# Patient Record
Sex: Female | Born: 1935 | Race: Black or African American | Hispanic: No | Smoking: Never smoker
Health system: Southern US, Community
[De-identification: ages and names within clinical notes are randomized; demographics above are authoritative.]

## PROBLEM LIST (undated history)

## (undated) DIAGNOSIS — M069 Rheumatoid arthritis, unspecified: Secondary | ICD-10-CM

## (undated) DIAGNOSIS — N189 Chronic kidney disease, unspecified: Secondary | ICD-10-CM

## (undated) DIAGNOSIS — F0390 Unspecified dementia without behavioral disturbance: Secondary | ICD-10-CM

## (undated) DIAGNOSIS — E785 Hyperlipidemia, unspecified: Secondary | ICD-10-CM

## (undated) DIAGNOSIS — I10 Essential (primary) hypertension: Secondary | ICD-10-CM

## (undated) HISTORY — PX: CATARACT EXTRACTION: SUR2

## (undated) HISTORY — DX: Hyperlipidemia, unspecified: E78.5

## (undated) HISTORY — DX: Essential (primary) hypertension: I10

## (undated) HISTORY — PX: HIATAL HERNIA REPAIR: SHX195

## (undated) HISTORY — DX: Chronic kidney disease, unspecified: N18.9

## (undated) HISTORY — PX: TUBAL LIGATION: SHX77

## (undated) HISTORY — DX: Unspecified dementia, unspecified severity, without behavioral disturbance, psychotic disturbance, mood disturbance, and anxiety: F03.90

## (undated) HISTORY — DX: Rheumatoid arthritis, unspecified: M06.9

## (undated) HISTORY — PX: ABDOMINAL HYSTERECTOMY: SUR658

---

## 1998-02-18 ENCOUNTER — Ambulatory Visit (HOSPITAL_COMMUNITY): Admission: RE | Admit: 1998-02-18 | Discharge: 1998-02-18 | Payer: Self-pay | Admitting: Cardiology

## 1998-03-08 ENCOUNTER — Ambulatory Visit (HOSPITAL_COMMUNITY): Admission: RE | Admit: 1998-03-08 | Discharge: 1998-03-08 | Payer: Self-pay | Admitting: Family Medicine

## 1998-04-01 ENCOUNTER — Ambulatory Visit (HOSPITAL_COMMUNITY): Admission: RE | Admit: 1998-04-01 | Discharge: 1998-04-01 | Payer: Self-pay | Admitting: Urology

## 2000-02-28 ENCOUNTER — Other Ambulatory Visit: Admission: RE | Admit: 2000-02-28 | Discharge: 2000-02-28 | Payer: Self-pay | Admitting: Family Medicine

## 2000-11-29 ENCOUNTER — Ambulatory Visit (HOSPITAL_COMMUNITY): Admission: RE | Admit: 2000-11-29 | Discharge: 2000-11-29 | Payer: Self-pay | Admitting: *Deleted

## 2000-12-11 ENCOUNTER — Ambulatory Visit (HOSPITAL_COMMUNITY): Admission: RE | Admit: 2000-12-11 | Discharge: 2000-12-11 | Payer: Self-pay | Admitting: *Deleted

## 2002-01-05 ENCOUNTER — Encounter: Payer: Self-pay | Admitting: Occupational Medicine

## 2002-01-05 ENCOUNTER — Encounter: Admission: RE | Admit: 2002-01-05 | Discharge: 2002-01-05 | Payer: Self-pay | Admitting: Occupational Medicine

## 2002-01-20 ENCOUNTER — Encounter: Admission: RE | Admit: 2002-01-20 | Discharge: 2002-01-27 | Payer: Self-pay | Admitting: Occupational Medicine

## 2002-07-28 ENCOUNTER — Other Ambulatory Visit: Admission: RE | Admit: 2002-07-28 | Discharge: 2002-07-28 | Payer: Self-pay | Admitting: Family Medicine

## 2002-07-31 ENCOUNTER — Encounter: Admission: RE | Admit: 2002-07-31 | Discharge: 2002-07-31 | Payer: Self-pay | Admitting: Occupational Medicine

## 2002-07-31 ENCOUNTER — Encounter: Payer: Self-pay | Admitting: Occupational Medicine

## 2004-08-31 ENCOUNTER — Other Ambulatory Visit: Admission: RE | Admit: 2004-08-31 | Discharge: 2004-08-31 | Payer: Self-pay | Admitting: Family Medicine

## 2010-10-16 ENCOUNTER — Encounter: Payer: Self-pay | Admitting: Internal Medicine

## 2014-08-26 ENCOUNTER — Ambulatory Visit
Admission: RE | Admit: 2014-08-26 | Discharge: 2014-08-26 | Disposition: A | Discharge status: Home / Self Care | Payer: Medicare Other | Source: Ambulatory Visit | Attending: Family Medicine | Admitting: Family Medicine

## 2014-08-26 ENCOUNTER — Other Ambulatory Visit: Payer: Self-pay | Admitting: Family Medicine

## 2014-08-26 DIAGNOSIS — M542 Cervicalgia: Secondary | ICD-10-CM

## 2015-08-11 ENCOUNTER — Ambulatory Visit (INDEPENDENT_AMBULATORY_CARE_PROVIDER_SITE_OTHER): Payer: PPO | Admitting: Podiatry

## 2015-08-11 VITALS — BP 137/75 | HR 73 | Resp 16 | Ht 61.0 in | Wt 118.0 lb

## 2015-08-11 DIAGNOSIS — L600 Ingrowing nail: Secondary | ICD-10-CM

## 2015-08-11 NOTE — Progress Notes (Signed)
Subjective:     Patient ID: Katherine Phillips, female   DOB: 1930/03/26, 81 y.o.   MRN: [id number]  HPI patient presents stating both my big toenails get very thick and damaged and they become painful and make it hard for me to wear shoe gear comfortably   Review of Systems  All other systems reviewed and are negative.      Objective:   Physical Exam  Constitutional: She is oriented to person, place, and time.  Cardiovascular: Intact distal pulses.   Musculoskeletal: Normal range of motion.  Neurological: She is oriented to person, place, and time.  Skin: Skin is warm.  Nursing note and vitals reviewed.  neurovascular status intact muscle strength adequate range of motion within normal limits with patient found to have damaged thickened hallux nails bilateral with incurvation of the borders pain and yellow discoloration with pain when pressed dorsally. Patient has good digital perfusion and is well oriented 3     Assessment:     Damaged hallux nails bilateral with pain and deformity noted    Plan:     H&P reviewed and condition discussed with patient and daughter. Patient wants the nails removed and understands the risk and recovery of nail removal and surgery. She is willing to accept risk and today I infiltrated each hallux 60 mg Xylocaine Marcaine mixture remove the hallux nails exposed matrix and applied phenol 5 applications 30 seconds followed by alcohol lavage and sterile dressing. Gave instructions on soaks and reappoint

## 2015-08-11 NOTE — Patient Instructions (Signed)

## 2015-08-11 NOTE — Progress Notes (Signed)
   Subjective:    Patient ID: Katherine Phillips, female    DOB: 06-Mar-1930, 81 y.o.   MRN: [id number]  HPI Patient presents with a bilateral nail problem, great toes, nail discoloration, thickened nails. Pt stated, "hurts to wear shoes because puts too much pressure on toes". This has been going on for the past 2 months.   Review of Systems  Respiratory: Positive for shortness of breath.   All other systems reviewed and are negative.      Objective:   Physical Exam        Assessment & Plan:

## 2015-08-16 ENCOUNTER — Telehealth: Payer: Self-pay | Admitting: *Deleted

## 2015-08-16 NOTE — Telephone Encounter (Signed)
Called patient at [phone] (Home #) to check to see how they were feeling from their nail that was taken off on Thursday, August 11, 2015. Pt stated, "toe is not hurting". Pt asked questions regarding soaking their toe with antibacterial soap. Pt stated they understood.

## 2015-08-22 ENCOUNTER — Telehealth: Payer: Self-pay | Admitting: *Deleted

## 2015-08-22 NOTE — Telephone Encounter (Signed)
Pt states she had a toenail procedure 08/11/2015 and was wondering if she needed to put antibiotic ointment on it.  I asked her if the area had a dry scab and she said no.  I asked her what type of soaks she was using and she stated Dial.  I told her to switch to [facility] soaks for the next week and to put the antibiotic ointment on when up and in a enclosed shoe, could allow to air dry when resting or in bed, and to do this until the area gets a dry hard scab.  Pt states understanding.

## 2015-08-26 NOTE — Telephone Encounter (Signed)
error 

## 2015-08-30 ENCOUNTER — Telehealth: Payer: Self-pay

## 2015-08-30 ENCOUNTER — Telehealth: Payer: Self-pay | Admitting: Cardiology

## 2015-08-30 NOTE — Telephone Encounter (Signed)
FAXED NOTES TO [facility] 08-30-15

## 2015-08-30 NOTE — Telephone Encounter (Signed)
Received records from [place] for appointment on 09/01/15 with Dr Percival Spanish.  Records given to [facility] (medical records) for Dr Hochrein's schedule on 09/01/15. lp

## 2015-09-01 ENCOUNTER — Ambulatory Visit (INDEPENDENT_AMBULATORY_CARE_PROVIDER_SITE_OTHER): Payer: PPO | Admitting: Cardiology

## 2015-09-01 ENCOUNTER — Encounter: Payer: Self-pay | Admitting: Cardiology

## 2015-09-01 VITALS — BP 156/72 | HR 69 | Ht 59.0 in | Wt 115.1 lb

## 2015-09-01 DIAGNOSIS — R011 Cardiac murmur, unspecified: Secondary | ICD-10-CM

## 2015-09-01 DIAGNOSIS — I350 Nonrheumatic aortic (valve) stenosis: Secondary | ICD-10-CM

## 2015-09-01 DIAGNOSIS — I10 Essential (primary) hypertension: Secondary | ICD-10-CM

## 2015-09-01 DIAGNOSIS — I119 Hypertensive heart disease without heart failure: Secondary | ICD-10-CM | POA: Insufficient documentation

## 2015-09-01 NOTE — Patient Instructions (Signed)
Your physician wants you to follow-up in: 2 Years. You will receive a reminder letter in the mail two months in advance. If you don't receive a letter, please call our office to schedule the follow-up appointment.  Your physician has requested that you have an echocardiogram. Echocardiography is a painless test that uses sound waves to create images of your heart. It provides your doctor with information about the size and shape of your heart and how well your heart's chambers and valves are working. This procedure takes approximately one hour. There are no restrictions for this procedure.  Merry Christmas and happy New Year!!

## 2015-09-01 NOTE — Progress Notes (Signed)
Cardiology Office Note   Date:  09/01/2015   ID:  Katherine Phillips, DOB [birth year], MRN [id number]  PCP:  Katherine Naas, MD  Cardiologist:   Katherine Breeding, MD   Chief Complaint  Patient presents with  . Heart Murmur  . Hypertension      History of Present Illness: Katherine Phillips is a 81 y.o. female who presents for evaluation of fatigue and a heart murmur.  I do notice that she's had an echocardiogram somewhere as I see mention of this in her primary care office notes. She has been found to have some mild aortic stenosis. The patient doesn't report any cardiac history. She actually feels pretty well. She denies any chest pressure, neck or arm discomfort. She doesn't report any shortness of breath. She says she takes care of her household chores. She doesn't describe any PND or orthopnea. She's had no weight gain or edema. She lives independently. She rakes leaves.   Past Medical History  Diagnosis Date  . CKD (chronic kidney disease)   . Hyperlipidemia   . HTN (hypertension)   . RA (rheumatoid arthritis) [facility])     Past Surgical History  Procedure Laterality Date  . Abdominal hysterectomy    . Tubal ligation    . Hiatal hernia repair    . Cataract extraction       Current Outpatient Prescriptions  Medication Sig Dispense Refill  . alendronate (FOSAMAX) 70 MG tablet Take 70 mg by mouth once a week.  11  . amLODipine (NORVASC) 5 MG tablet 1 TABLET ONCE A DAY ORALLY 30 DAYS  3  . carvedilol (COREG) 6.25 MG tablet Take 6.25 mg by mouth 2 (two) times daily.  1  . diphenhydrAMINE (BENADRYL) 25 mg capsule TAKE ONE CAPSULE BY MOUTH EVERY 6 HOURS AS NEEDED FOR ITCHING  0  . furosemide (LASIX) 20 MG tablet 1/2 TAB ONCE A DAY ORALLY 90 DAYS  1  . lisinopril (PRINIVIL,ZESTRIL) 30 MG tablet 1 TABLET ONCE A DAY BY MOUTH 90 DAYS  0  . pravastatin (PRAVACHOL) 40 MG tablet Take 40 mg by mouth daily.  0  . predniSONE (DELTASONE) 20 MG tablet TAKE 2 TABLETS BY MOUTH FOR 2 DAYS  THEN 1 TABLET DAILY FOR 2 DAYS THEN 1/2TABLET FOR 2 DAYS  0   No current facility-administered medications for this visit.    Allergies:   Review of patient's allergies indicates no known allergies.    Social History:  The patient  reports that she has never smoked. She does not have any smokeless tobacco history on file.   Family History:  The patient's family history includes Cancer in her sister; Hypertension in her mother.    ROS:  Please see the history of present illness.   Otherwise, review of systems are positive for neck pain with certain positions.   All other systems are reviewed and negative.    PHYSICAL EXAM: VS:  BP 156/72 mmHg  Pulse 69  Ht 4\' 11"  (1.499 m)  Wt 115 lb 1.6 oz (52.209 kg)  BMI 23.23 kg/m2 , BMI Body mass index is 23.23 kg/(m^2). GENERAL:  Well appearing HEENT:  Pupils equal round and reactive, fundi not visualized, oral mucosa unremarkable NECK:  No jugular venous distention, waveform within normal limits, carotid upstroke brisk and symmetric, no bruits, no thyromegaly LYMPHATICS:  No cervical, inguinal adenopathy LUNGS:  Clear to auscultation bilaterally BACK:  No CVA tenderness CHEST:  Unremarkable HEART:  PMI not displaced or sustained,S1 and  S2 within normal limits, no S3, no S4, no clicks, no rubs, 2 out of 6 apical systolic murmur radiating slightly out the aortic outflow tract and early peaking, no diastolic murmurs ABD:  Flat, positive bowel sounds normal in frequency in pitch, no bruits, no rebound, no guarding, no midline pulsatile mass, no hepatomegaly, no splenomegaly EXT:  2 plus pulses throughout, no edema, no cyanosis no clubbing SKIN:  No rashes no nodules NEURO:  Cranial nerves II through XII grossly intact, motor grossly intact throughout PSYCH:  Cognitively intact, oriented to person place and time    EKG:  EKG is ordered today. The ekg ordered today demonstrates sinus rhythm, rate 69, left axis deviation, left anterior  fascicular block, borderline interventricular conduction delay   Recent Labs: No results found for requested labs within last 365 days.    Lipid Panel No results found for: CHOL, TRIG, HDL, CHOLHDL, VLDL, LDLCALC, LDLDIRECT    Wt Readings from Last 3 Encounters:  09/01/15 115 lb 1.6 oz (52.209 kg)  08/11/15 118 lb (53.524 kg)      Other studies Reviewed: Additional studies/ records that were reviewed today include: [facility] office records. Review of the above records demonstrates:  Please see elsewhere in the note.     ASSESSMENT AND PLAN:  MURMUR:  The patient is a slight systolic murmur which I suspect to be some aortic sclerosis. I will repeat an echocardiogram. This will also allow [place] to evaluate her abnormal echo which may represent some LVH.  HTN:  Her blood pressure slightly elevated today but he was okay at her most recent office visit. He's on multiple medicines. No change in therapy is indicated.  FATIGUE:  The patient actually does not report this to me today.     Current medicines are reviewed at length with the patient today.  The patient does not have concerns regarding medicines.  The following changes have been made:  no change  Labs/ tests ordered today include:   Orders Placed This Encounter  Procedures  . EKG 12-Lead  . ECHOCARDIOGRAM COMPLETE     Disposition:   FU with me in two years    Signed, Katherine Breeding, MD  09/01/2015 10:50 AM    [place]

## 2015-09-15 ENCOUNTER — Other Ambulatory Visit (HOSPITAL_COMMUNITY): Payer: PPO

## 2015-10-19 DIAGNOSIS — N183 Chronic kidney disease, stage 3 (moderate): Secondary | ICD-10-CM | POA: Diagnosis not present

## 2015-10-19 DIAGNOSIS — L821 Other seborrheic keratosis: Secondary | ICD-10-CM | POA: Diagnosis not present

## 2015-10-19 DIAGNOSIS — R609 Edema, unspecified: Secondary | ICD-10-CM | POA: Diagnosis not present

## 2015-11-16 DIAGNOSIS — I10 Essential (primary) hypertension: Secondary | ICD-10-CM | POA: Diagnosis not present

## 2015-11-16 DIAGNOSIS — N183 Chronic kidney disease, stage 3 (moderate): Secondary | ICD-10-CM | POA: Diagnosis not present

## 2015-11-16 DIAGNOSIS — R609 Edema, unspecified: Secondary | ICD-10-CM | POA: Diagnosis not present

## 2015-12-15 DIAGNOSIS — I10 Essential (primary) hypertension: Secondary | ICD-10-CM | POA: Diagnosis not present

## 2015-12-21 DIAGNOSIS — H401131 Primary open-angle glaucoma, bilateral, mild stage: Secondary | ICD-10-CM | POA: Diagnosis not present

## 2015-12-21 DIAGNOSIS — Z961 Presence of intraocular lens: Secondary | ICD-10-CM | POA: Diagnosis not present

## 2016-01-11 DIAGNOSIS — R2689 Other abnormalities of gait and mobility: Secondary | ICD-10-CM | POA: Diagnosis not present

## 2016-01-11 DIAGNOSIS — R609 Edema, unspecified: Secondary | ICD-10-CM | POA: Diagnosis not present

## 2016-01-11 DIAGNOSIS — R5383 Other fatigue: Secondary | ICD-10-CM | POA: Diagnosis not present

## 2016-01-11 DIAGNOSIS — R0609 Other forms of dyspnea: Secondary | ICD-10-CM | POA: Diagnosis not present

## 2016-01-11 DIAGNOSIS — R634 Abnormal weight loss: Secondary | ICD-10-CM | POA: Diagnosis not present

## 2016-01-18 ENCOUNTER — Telehealth: Payer: Self-pay | Admitting: Cardiology

## 2016-01-18 NOTE — Telephone Encounter (Signed)
Received records from [place] for appointment on 01/24/16 with Dr Percival Spanish.  Records given to [facility] (medical records) for Dr Hochrein's schedule on 01/24/16. lp

## 2016-01-23 NOTE — Progress Notes (Signed)
Cardiology Office Note   Date:  01/24/2016   ID:  Katherine Phillips, DOB August 26, 1930, MRN [id number]  PCP:  Reginia Naas, MD  Cardiologist:   Minus Breeding, MD   Chief Complaint  Patient presents with  . Edema      History of Present Illness: Katherine Phillips is a 82 y.o. female who presents for evaluation of heart murmur.  She was referred for this at the end of last year. 2012 echo had demonstrated some mild AS. I had set her up to have an echocardiogram. However, this was canceled and the comments says that she had no transportation. She did not call to reschedule. However, she says she doesn't cancel in this. I see from her primary care office and she is being sent back because she has had some complaints of shortness of breath and lower externally swelling. She had lab work that demonstrated a very mildly elevated BNP level of 293 recently. . The patient doesn't report any cardiac history. She denies any chest pressure, neck or arm discomfort. She does report shortness breath with activity such as walking a short distance on level ground but she's not describing PND or orthopnea. She says that her feet are swelling she is up on them. She doesn't check her weight. She does watch her salt. She's able to do her chores of living including vacuuming. She doesn't describe any palpitations, presyncope or syncope.  Past Medical History  Diagnosis Date  . CKD (chronic kidney disease)   . Hyperlipidemia   . HTN (hypertension)   . RA (rheumatoid arthritis) [facility])     Past Surgical History  Procedure Laterality Date  . Abdominal hysterectomy    . Tubal ligation    . Hiatal hernia repair    . Cataract extraction       Current Outpatient Prescriptions  Medication Sig Dispense Refill  . alendronate (FOSAMAX) 70 MG tablet Take 70 mg by mouth once a week.  11  . amLODipine (NORVASC) 5 MG tablet 1 TABLET ONCE A DAY ORALLY 30 DAYS  3  . carvedilol (COREG) 6.25 MG tablet Take 6.25 mg  by mouth 2 (two) times daily.  1  . furosemide (LASIX) 20 MG tablet 1/2 TAB ONCE A DAY ORALLY 90 DAYS  1  . pravastatin (PRAVACHOL) 40 MG tablet Take 40 mg by mouth daily.  0   No current facility-administered medications for this visit.    Allergies:   Review of patient's allergies indicates no known allergies.    ROS:  Please see the history of present illness.   Otherwise, review of systems are positive for neck pain with certain positions.   All other systems are reviewed and negative.    PHYSICAL EXAM: VS:  BP 141/72 mmHg  Pulse 68  Ht 5' (1.524 m)  Wt 112 lb (50.803 kg)  BMI 21.87 kg/m2 , BMI Body mass index is 21.87 kg/(m^2). GENERAL:  Well appearing HEENT:  Pupils equal round and reactive, fundi not visualized, oral mucosa unremarkable, Edentulous NECK:  No jugular venous distention, waveform within normal limits, carotid upstroke brisk and symmetric, no bruits, no thyromegaly LYMPHATICS:  No cervical, inguinal adenopathy LUNGS:  Clear to auscultation bilaterally BACK:  No CVA tenderness CHEST:  Unremarkable HEART:  PMI not displaced or sustained,S1 and S2 within normal limits, no S3, no S4, no clicks, no rubs, 2 out of 6 apical systolic murmur radiating slightly out the aortic outflow tract and early peaking, no diastolic murmurs ABD:  Flat, positive bowel sounds normal in frequency in pitch, no bruits, no rebound, no guarding, no midline pulsatile mass, no hepatomegaly, no splenomegaly EXT:  2 plus pulses throughout, no edema, no cyanosis no clubbing SKIN:  No rashes no nodules     EKG:  EKG is ordered today. The ekg ordered today demonstrates sinus rhythm, rate 98 left axis deviation, left anterior fascicular block, borderline interventricular conduction delay. No change from previous   Recent Labs: No results found for requested labs within last 365 days.    Lipid Panel No results found for: CHOL, TRIG, HDL, CHOLHDL, VLDL, LDLCALC, LDLDIRECT    Wt Readings  from Last 3 Encounters:  01/24/16 112 lb (50.803 kg)  09/01/15 115 lb 1.6 oz (52.209 kg)  08/11/15 118 lb (53.524 kg)      Other studies Reviewed: Additional studies/ records that were reviewed today include: [facility] office records. Review of the above records demonstrates:  Please see elsewhere in the note.     ASSESSMENT AND PLAN:  MURMUR:  The patient is a slight systolic murmur which I suspect to be some aortic sclerosis. I will repeat an echocardiogram. This will also allow [place] to evaluate her abnormal echo which may represent some LVH.  However, I did unlikely there'll find any disease that needs to be treated other than with salt and fluid restriction.  HTN:  Her blood pressure is controlled. She will continue the meds as listed.  FATIGUE:  The patient does report this as a complaint. I reviewed labs. She is not anemic. He does have some mild renal insufficiency. This has been stable. I do not see a recent TSH the one done in 2014 was normal. I would not suspect that fatigue was of a cardiac etiology. I don't suspect sleep apnea. I will defer further lab workup to Dr. Tamala Julian.  Current medicines are reviewed at length with the patient today.  The patient does not have concerns regarding medicines.  The following changes have been made:  no change  Labs/ tests ordered today include:   Orders Placed This Encounter  Procedures  . EKG 12-Lead     Disposition:   FU with me as needed based on the results of the echo.    Signed, Minus Breeding, MD  01/24/2016 2:44 PM    [place]

## 2016-01-24 ENCOUNTER — Encounter: Payer: Self-pay | Admitting: Cardiology

## 2016-01-24 ENCOUNTER — Ambulatory Visit (INDEPENDENT_AMBULATORY_CARE_PROVIDER_SITE_OTHER): Payer: PPO | Admitting: Cardiology

## 2016-01-24 VITALS — BP 141/72 | HR 68 | Ht 60.0 in | Wt 112.0 lb

## 2016-01-24 DIAGNOSIS — I10 Essential (primary) hypertension: Secondary | ICD-10-CM

## 2016-01-24 DIAGNOSIS — I350 Nonrheumatic aortic (valve) stenosis: Secondary | ICD-10-CM | POA: Diagnosis not present

## 2016-01-24 DIAGNOSIS — R011 Cardiac murmur, unspecified: Secondary | ICD-10-CM | POA: Diagnosis not present

## 2016-01-24 DIAGNOSIS — R0602 Shortness of breath: Secondary | ICD-10-CM

## 2016-01-24 NOTE — Addendum Note (Signed)
Addended by: Vennie Homans on: 01/24/2016 03:48 PM   Modules accepted: Orders

## 2016-01-24 NOTE — Patient Instructions (Signed)
Medication Instructions:  Continue Current medication therapy  Labwork: NONE  Testing/Procedures: Your physician has requested that you have an echocardiogram. Echocardiography is a painless test that uses sound waves to create images of your heart. It provides your doctor with information about the size and shape of your heart and how well your heart's chambers and valves are working. This procedure takes approximately one hour. There are no restrictions for this procedure.   Follow-Up: As Needed  Any Other Special Instructions Will Be Listed Below (If Applicable).   If you need a refill on your cardiac medications before your next appointment, please call your pharmacy.

## 2016-02-08 ENCOUNTER — Ambulatory Visit (HOSPITAL_COMMUNITY): Payer: PPO | Attending: Cardiology

## 2016-02-08 ENCOUNTER — Other Ambulatory Visit: Payer: Self-pay

## 2016-02-08 DIAGNOSIS — I351 Nonrheumatic aortic (valve) insufficiency: Secondary | ICD-10-CM | POA: Insufficient documentation

## 2016-02-08 DIAGNOSIS — E785 Hyperlipidemia, unspecified: Secondary | ICD-10-CM | POA: Insufficient documentation

## 2016-02-08 DIAGNOSIS — I129 Hypertensive chronic kidney disease with stage 1 through stage 4 chronic kidney disease, or unspecified chronic kidney disease: Secondary | ICD-10-CM | POA: Insufficient documentation

## 2016-02-08 DIAGNOSIS — N281 Cyst of kidney, acquired: Secondary | ICD-10-CM | POA: Diagnosis not present

## 2016-02-08 DIAGNOSIS — I350 Nonrheumatic aortic (valve) stenosis: Secondary | ICD-10-CM

## 2016-02-08 DIAGNOSIS — R0602 Shortness of breath: Secondary | ICD-10-CM | POA: Diagnosis not present

## 2016-02-08 DIAGNOSIS — I10 Essential (primary) hypertension: Secondary | ICD-10-CM | POA: Diagnosis not present

## 2016-02-08 DIAGNOSIS — N189 Chronic kidney disease, unspecified: Secondary | ICD-10-CM | POA: Insufficient documentation

## 2016-02-08 DIAGNOSIS — R011 Cardiac murmur, unspecified: Secondary | ICD-10-CM | POA: Diagnosis not present

## 2016-02-16 ENCOUNTER — Telehealth: Payer: Self-pay | Admitting: *Deleted

## 2016-02-16 DIAGNOSIS — N281 Cyst of kidney, acquired: Secondary | ICD-10-CM

## 2016-02-16 NOTE — Telephone Encounter (Signed)
-----   Message from Minus Breeding, MD sent at 02/12/2016  9:16 PM EDT ----- There was mild aortic sclerosis.  EF is low normal.  There were renal cysts which likely can simply be followed.  However, we should check a dedicated renal ultrasound.  Call Ms. Menges with the results and send results to [facility], MD

## 2016-02-16 NOTE — Telephone Encounter (Signed)
Spoke with pt about her Echo, renal ultrasound was ordered and send to scheduler to be schedule

## 2016-02-23 ENCOUNTER — Ambulatory Visit (HOSPITAL_COMMUNITY)
Admission: RE | Admit: 2016-02-23 | Discharge: 2016-02-23 | Disposition: A | Discharge status: Home / Self Care | Payer: PPO | Source: Ambulatory Visit | Attending: Cardiology | Admitting: Cardiology

## 2016-02-23 DIAGNOSIS — K551 Chronic vascular disorders of intestine: Secondary | ICD-10-CM | POA: Insufficient documentation

## 2016-02-23 DIAGNOSIS — I10 Essential (primary) hypertension: Secondary | ICD-10-CM | POA: Insufficient documentation

## 2016-02-23 DIAGNOSIS — N281 Cyst of kidney, acquired: Secondary | ICD-10-CM

## 2016-02-23 DIAGNOSIS — Q6102 Congenital multiple renal cysts: Secondary | ICD-10-CM | POA: Insufficient documentation

## 2016-02-27 ENCOUNTER — Telehealth: Payer: Self-pay | Admitting: *Deleted

## 2016-02-27 DIAGNOSIS — N281 Cyst of kidney, acquired: Secondary | ICD-10-CM

## 2016-02-27 NOTE — Telephone Encounter (Signed)
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  She has renal cysts.  I would like to repeat a renal ultrasound in one year to follow up.  Please schedule this.  Please send result to the patient.  A copy of this result note will be sent to [facility], MD

## 2016-02-27 NOTE — Telephone Encounter (Signed)
Spoke with pt about her renal doppler, pt voice understanding  Order for renal was placed to get down in 1 year  Result send to to pt PCP via Epic

## 2016-04-04 DIAGNOSIS — M8580 Other specified disorders of bone density and structure, unspecified site: Secondary | ICD-10-CM | POA: Diagnosis not present

## 2016-04-04 DIAGNOSIS — E7800 Pure hypercholesterolemia, unspecified: Secondary | ICD-10-CM | POA: Diagnosis not present

## 2016-04-04 DIAGNOSIS — E210 Primary hyperparathyroidism: Secondary | ICD-10-CM | POA: Diagnosis not present

## 2016-04-04 DIAGNOSIS — Z1389 Encounter for screening for other disorder: Secondary | ICD-10-CM | POA: Diagnosis not present

## 2016-04-04 DIAGNOSIS — H6123 Impacted cerumen, bilateral: Secondary | ICD-10-CM | POA: Diagnosis not present

## 2016-04-04 DIAGNOSIS — I350 Nonrheumatic aortic (valve) stenosis: Secondary | ICD-10-CM | POA: Diagnosis not present

## 2016-04-04 DIAGNOSIS — I10 Essential (primary) hypertension: Secondary | ICD-10-CM | POA: Diagnosis not present

## 2016-04-04 DIAGNOSIS — M0540 Rheumatoid myopathy with rheumatoid arthritis of unspecified site: Secondary | ICD-10-CM | POA: Diagnosis not present

## 2016-04-04 DIAGNOSIS — Z0001 Encounter for general adult medical examination with abnormal findings: Secondary | ICD-10-CM | POA: Diagnosis not present

## 2016-04-04 DIAGNOSIS — Z0000 Encounter for general adult medical examination without abnormal findings: Secondary | ICD-10-CM | POA: Diagnosis not present

## 2016-04-04 DIAGNOSIS — Z1211 Encounter for screening for malignant neoplasm of colon: Secondary | ICD-10-CM | POA: Diagnosis not present

## 2016-04-04 DIAGNOSIS — R531 Weakness: Secondary | ICD-10-CM | POA: Diagnosis not present

## 2016-04-04 DIAGNOSIS — N183 Chronic kidney disease, stage 3 (moderate): Secondary | ICD-10-CM | POA: Diagnosis not present

## 2016-04-10 ENCOUNTER — Telehealth: Payer: Self-pay | Admitting: Cardiology

## 2016-04-10 DIAGNOSIS — Z1211 Encounter for screening for malignant neoplasm of colon: Secondary | ICD-10-CM | POA: Diagnosis not present

## 2016-04-10 NOTE — Telephone Encounter (Signed)
Records received from [place] 04/10/16 gave records to [facility] ( Medical records) CN

## 2016-04-13 DIAGNOSIS — E213 Hyperparathyroidism, unspecified: Secondary | ICD-10-CM | POA: Diagnosis not present

## 2016-04-13 DIAGNOSIS — N189 Chronic kidney disease, unspecified: Secondary | ICD-10-CM | POA: Diagnosis not present

## 2016-04-13 DIAGNOSIS — I10 Essential (primary) hypertension: Secondary | ICD-10-CM | POA: Diagnosis not present

## 2016-04-13 DIAGNOSIS — M8580 Other specified disorders of bone density and structure, unspecified site: Secondary | ICD-10-CM | POA: Diagnosis not present

## 2016-05-03 DIAGNOSIS — M8588 Other specified disorders of bone density and structure, other site: Secondary | ICD-10-CM | POA: Diagnosis not present

## 2016-05-13 NOTE — Progress Notes (Signed)
Cardiology Office Note   Date:  05/14/2016   ID:  Katherine Phillips, DOB May 13, 1930, MRN [id number]  PCP:  Reginia Naas, MD  Cardiologist:   Minus Breeding, MD   No chief complaint on file.     History of Present Illness: Katherine Phillips is a 82 y.o. female who presents for evaluation of heart murmur.  2012 echo had demonstrated some mild AS.  She did not come back at that time for the follow up echo.  I saw her earlier this year with SOB and a mildly elevated BNP.  I sent her for an echo.  There was mild aortic sclerosis.  EF is low normal.  There were renal cysts.  Dedicated renal ultrasound confirmed the cysts although the images were suboptimal    She returns for follow up.    She is referred back by her Reginia Naas, MD.  She complains predominantly of fatigue all over. She feels like she is off balance when she turns. She's not describing orthostatic symptoms. She's not describing presyncope or syncope. She has a generalized weakness. She's not describing any specific chest pressure, neck or arm discomfort. She's had no new shortness of breath, PND or orthopnea. She had no weight gain.  However, she complains of edema in her legs.   Past Medical History:  Diagnosis Date  . CKD (chronic kidney disease)   . HTN (hypertension)   . Hyperlipidemia   . RA (rheumatoid arthritis) ([place])     Past Surgical History:  Procedure Laterality Date  . ABDOMINAL HYSTERECTOMY    . CATARACT EXTRACTION    . HIATAL HERNIA REPAIR    . TUBAL LIGATION       Current Outpatient Prescriptions  Medication Sig Dispense Refill  . alendronate (FOSAMAX) 70 MG tablet Take 70 mg by mouth once a week.  11  . amLODipine (NORVASC) 5 MG tablet 1 TABLET ONCE A DAY ORALLY 30 DAYS  3  . carvedilol (COREG) 6.25 MG tablet Take 6.25 mg by mouth 2 (two) times daily.  1  . furosemide (LASIX) 20 MG tablet 1/2 TAB ONCE A DAY ORALLY 90 DAYS  1  . pravastatin (PRAVACHOL) 40 MG tablet Take 40 mg by  mouth daily.  0   No current facility-administered medications for this visit.     Allergies:   Review of patient's allergies indicates no known allergies.    ROS:  Please see the history of present illness.   Otherwise, review of systems are positive for constipation.   All other systems are reviewed and negative.    PHYSICAL EXAM: VS:  BP 124/60 (BP Location: Left Arm, Patient Position: Sitting, Cuff Size: Normal)   Pulse 66   Ht 5' (1.524 m)   Wt 116 lb (52.6 kg)   BMI 22.65 kg/m  , BMI Body mass index is 22.65 kg/m. GENERAL:  Well appearing HEENT:  Pupils equal round and reactive, fundi not visualized, oral mucosa unremarkable, Edentulous NECK:  No jugular venous distention, waveform within normal limits, carotid upstroke brisk and symmetric, no bruits, no thyromegaly LYMPHATICS:  No cervical, inguinal adenopathy LUNGS:  Clear to auscultation bilaterally BACK:  No CVA tenderness CHEST:  Unremarkable HEART:  PMI not displaced or sustained,S1 and S2 within normal limits, no S3, no S4, no clicks, no rubs, 2 out of 6 apical systolic murmur radiating slightly out the aortic outflow tract and early peaking, no diastolic murmurs ABD:  Flat, positive bowel sounds normal in frequency in pitch,  no bruits, no rebound, no guarding, no midline pulsatile mass, no hepatomegaly, no splenomegaly EXT:  2 plus pulses throughout, trace edema, no cyanosis no clubbing SKIN:  No rashes no nodules     EKG:  EKG is not ordered today.    Recent Labs: No results found for requested labs within last 8760 hours.    Lipid Panel No results found for: CHOL, TRIG, HDL, CHOLHDL, VLDL, LDLCALC, LDLDIRECT    Wt Readings from Last 3 Encounters:  05/14/16 116 lb (52.6 kg)  01/24/16 112 lb (50.8 kg)  09/01/15 115 lb 1.6 oz (52.2 kg)      Other studies Reviewed: Additional studies/ records that were reviewed today include: [facility] office records. Review of the above records demonstrates:  Please see  elsewhere in the note.     ASSESSMENT AND PLAN:  MURMUR:  Mild AS.  No further work up is suggested.    RENAL CYSTS:   These could not be adequately visualized with renal ultrasound. Therefore, I will check a noncontrast CT.  HTN:  Her blood pressure is controlled. She will continue the meds as listed.  FATIGUE:   This was a significant complaint previously. I don't see a TSH in 2014 I'll take the liberty of drawing one. She was not anemic earlier this year and other labs were okay.  However, I don't strongly suspect a cardiac etiology. She did have symptoms consistent with sleep apnea.  EDEMA:  She complains of some mild edema. She did have an elevated BNP previously but isn't describing shortness of breath in particular. Her edema is very trace on exam. She will continue with a low dose diuretic. I'm not suspecting overt heart failure although she might have some slight diastolic dysfunction.  Current medicines are reviewed at length with the patient today.  The patient does not have concerns regarding medicines.  The following changes have been made:  no change  Labs/ tests ordered today include:   No orders of the defined types were placed in this encounter.    Disposition:   FU with me as needed.    Signed, Minus Breeding, MD  05/14/2016 9:38 AM    [facility]

## 2016-05-14 ENCOUNTER — Encounter: Payer: Self-pay | Admitting: Cardiology

## 2016-05-14 ENCOUNTER — Encounter (INDEPENDENT_AMBULATORY_CARE_PROVIDER_SITE_OTHER): Payer: Self-pay

## 2016-05-14 ENCOUNTER — Ambulatory Visit (INDEPENDENT_AMBULATORY_CARE_PROVIDER_SITE_OTHER): Payer: PPO | Admitting: Cardiology

## 2016-05-14 VITALS — BP 124/60 | HR 66 | Ht 60.0 in | Wt 116.0 lb

## 2016-05-14 DIAGNOSIS — N281 Cyst of kidney, acquired: Secondary | ICD-10-CM

## 2016-05-14 DIAGNOSIS — M7989 Other specified soft tissue disorders: Secondary | ICD-10-CM | POA: Diagnosis not present

## 2016-05-14 DIAGNOSIS — R5383 Other fatigue: Secondary | ICD-10-CM

## 2016-05-14 DIAGNOSIS — Q6100 Congenital renal cyst, unspecified: Secondary | ICD-10-CM | POA: Diagnosis not present

## 2016-05-14 DIAGNOSIS — R011 Cardiac murmur, unspecified: Secondary | ICD-10-CM | POA: Diagnosis not present

## 2016-05-14 LAB — TSH: TSH: 1.52 mIU/L

## 2016-05-14 NOTE — Patient Instructions (Signed)
Medication Instructions:  Continue current medications  Labwork: TSH  Testing/Procedures: Non-Cardiac CT scanning, (CAT scanning), is a noninvasive, special x-ray that produces cross-sectional images of the body using x-rays and a computer. CT scans help physicians diagnose and treat medical conditions. For some CT exams, a contrast material is used to enhance visibility in the area of the body being studied. CT scans provide greater clarity and reveal more details than regular x-ray exams.   Follow-Up: After CT  Any Other Special Instructions Will Be Listed Below (If Applicable).  If you need a refill on your cardiac medications before your next appointment, please call your pharmacy.

## 2016-05-24 ENCOUNTER — Telehealth: Payer: Self-pay | Admitting: Cardiology

## 2016-05-24 NOTE — Telephone Encounter (Signed)
Called [facility] inquiring about CT abdomen.  They have tried to contact the patient twice to schedule this.  They will try one more time.

## 2016-06-05 ENCOUNTER — Ambulatory Visit
Admission: RE | Admit: 2016-06-05 | Discharge: 2016-06-05 | Disposition: A | Discharge status: Home / Self Care | Payer: PPO | Source: Ambulatory Visit | Attending: Cardiology | Admitting: Cardiology

## 2016-06-05 DIAGNOSIS — N281 Cyst of kidney, acquired: Secondary | ICD-10-CM | POA: Diagnosis not present

## 2016-06-22 DIAGNOSIS — H01022 Squamous blepharitis right lower eyelid: Secondary | ICD-10-CM | POA: Diagnosis not present

## 2016-06-22 DIAGNOSIS — H01025 Squamous blepharitis left lower eyelid: Secondary | ICD-10-CM | POA: Diagnosis not present

## 2016-06-22 DIAGNOSIS — H353112 Nonexudative age-related macular degeneration, right eye, intermediate dry stage: Secondary | ICD-10-CM | POA: Diagnosis not present

## 2016-06-22 DIAGNOSIS — H401131 Primary open-angle glaucoma, bilateral, mild stage: Secondary | ICD-10-CM | POA: Diagnosis not present

## 2016-06-22 DIAGNOSIS — H31012 Macula scars of posterior pole (postinflammatory) (post-traumatic), left eye: Secondary | ICD-10-CM | POA: Diagnosis not present

## 2016-06-22 DIAGNOSIS — H01021 Squamous blepharitis right upper eyelid: Secondary | ICD-10-CM | POA: Diagnosis not present

## 2016-06-22 DIAGNOSIS — H01024 Squamous blepharitis left upper eyelid: Secondary | ICD-10-CM | POA: Diagnosis not present

## 2016-06-28 ENCOUNTER — Encounter (INDEPENDENT_AMBULATORY_CARE_PROVIDER_SITE_OTHER): Payer: PPO | Admitting: Ophthalmology

## 2016-06-28 DIAGNOSIS — I10 Essential (primary) hypertension: Secondary | ICD-10-CM | POA: Diagnosis not present

## 2016-06-28 DIAGNOSIS — H35033 Hypertensive retinopathy, bilateral: Secondary | ICD-10-CM | POA: Diagnosis not present

## 2016-06-28 DIAGNOSIS — H353221 Exudative age-related macular degeneration, left eye, with active choroidal neovascularization: Secondary | ICD-10-CM | POA: Diagnosis not present

## 2016-06-28 DIAGNOSIS — H43813 Vitreous degeneration, bilateral: Secondary | ICD-10-CM | POA: Diagnosis not present

## 2016-07-26 ENCOUNTER — Encounter (INDEPENDENT_AMBULATORY_CARE_PROVIDER_SITE_OTHER): Payer: PPO | Admitting: Ophthalmology

## 2016-07-26 DIAGNOSIS — H43813 Vitreous degeneration, bilateral: Secondary | ICD-10-CM

## 2016-07-26 DIAGNOSIS — I10 Essential (primary) hypertension: Secondary | ICD-10-CM | POA: Diagnosis not present

## 2016-07-26 DIAGNOSIS — H35033 Hypertensive retinopathy, bilateral: Secondary | ICD-10-CM

## 2016-07-26 DIAGNOSIS — H353231 Exudative age-related macular degeneration, bilateral, with active choroidal neovascularization: Secondary | ICD-10-CM

## 2016-08-23 ENCOUNTER — Encounter (INDEPENDENT_AMBULATORY_CARE_PROVIDER_SITE_OTHER): Payer: PPO | Admitting: Ophthalmology

## 2016-09-06 DIAGNOSIS — H353221 Exudative age-related macular degeneration, left eye, with active choroidal neovascularization: Secondary | ICD-10-CM | POA: Diagnosis not present

## 2016-09-06 DIAGNOSIS — H353124 Nonexudative age-related macular degeneration, left eye, advanced atrophic with subfoveal involvement: Secondary | ICD-10-CM | POA: Diagnosis not present

## 2016-09-06 DIAGNOSIS — H353112 Nonexudative age-related macular degeneration, right eye, intermediate dry stage: Secondary | ICD-10-CM | POA: Diagnosis not present

## 2016-09-06 DIAGNOSIS — Z961 Presence of intraocular lens: Secondary | ICD-10-CM | POA: Diagnosis not present

## 2016-09-12 DIAGNOSIS — H353221 Exudative age-related macular degeneration, left eye, with active choroidal neovascularization: Secondary | ICD-10-CM | POA: Diagnosis not present

## 2016-09-20 DIAGNOSIS — R413 Other amnesia: Secondary | ICD-10-CM | POA: Diagnosis not present

## 2016-09-20 DIAGNOSIS — F39 Unspecified mood [affective] disorder: Secondary | ICD-10-CM | POA: Diagnosis not present

## 2016-09-20 DIAGNOSIS — M25541 Pain in joints of right hand: Secondary | ICD-10-CM | POA: Diagnosis not present

## 2016-09-20 DIAGNOSIS — M25542 Pain in joints of left hand: Secondary | ICD-10-CM | POA: Diagnosis not present

## 2016-09-20 DIAGNOSIS — R609 Edema, unspecified: Secondary | ICD-10-CM | POA: Diagnosis not present

## 2016-09-21 DIAGNOSIS — E538 Deficiency of other specified B group vitamins: Secondary | ICD-10-CM | POA: Diagnosis not present

## 2016-09-25 ENCOUNTER — Other Ambulatory Visit: Payer: Self-pay | Admitting: Family Medicine

## 2016-09-25 DIAGNOSIS — R413 Other amnesia: Secondary | ICD-10-CM

## 2016-10-06 ENCOUNTER — Ambulatory Visit
Admission: RE | Admit: 2016-10-06 | Discharge: 2016-10-06 | Disposition: A | Discharge status: Home / Self Care | Payer: PPO | Source: Ambulatory Visit | Attending: Family Medicine | Admitting: Family Medicine

## 2016-10-06 DIAGNOSIS — R413 Other amnesia: Secondary | ICD-10-CM | POA: Diagnosis not present

## 2016-10-08 DIAGNOSIS — E538 Deficiency of other specified B group vitamins: Secondary | ICD-10-CM | POA: Diagnosis not present

## 2016-10-08 DIAGNOSIS — N183 Chronic kidney disease, stage 3 (moderate): Secondary | ICD-10-CM | POA: Diagnosis not present

## 2016-10-08 DIAGNOSIS — I10 Essential (primary) hypertension: Secondary | ICD-10-CM | POA: Diagnosis not present

## 2016-10-08 DIAGNOSIS — E7800 Pure hypercholesterolemia, unspecified: Secondary | ICD-10-CM | POA: Diagnosis not present

## 2016-10-12 ENCOUNTER — Ambulatory Visit: Payer: PPO | Admitting: Podiatry

## 2016-10-18 ENCOUNTER — Ambulatory Visit (INDEPENDENT_AMBULATORY_CARE_PROVIDER_SITE_OTHER): Payer: PPO | Admitting: Podiatry

## 2016-10-18 ENCOUNTER — Encounter: Payer: Self-pay | Admitting: Podiatry

## 2016-10-18 DIAGNOSIS — L600 Ingrowing nail: Secondary | ICD-10-CM

## 2016-10-18 NOTE — Progress Notes (Signed)
Subjective:     Patient ID: Katherine Phillips, female   DOB: 1930-07-05, 83 y.o.   MRN: [id number]  HPI patient presents stating these hallux nails are bothering me when I wear shoe gear and I like the entire nail removed   Review of Systems     Objective:   Physical Exam Neurovascular status intact negative Homans sign was noted with patient doing well over the last years. The hallux nails bilateral for the most part didn't respond to chemical but the center portion of the nails are thick and present and there is looseness of the nailbeds noted    Assessment:     Chronic nail disease hallux bilateral    Plan:     Recommended nail removal and today I discussed procedures for this condition. I infiltrated each hallux 60 mg like Marcaine mixture remove the hallux nails exposed matrix and applied phenol 5 applications 30 seconds followed by alcohol lavage and sterile dressings. Gave instructions on soaks and reappoint

## 2016-10-18 NOTE — Patient Instructions (Signed)

## 2016-10-22 DIAGNOSIS — E538 Deficiency of other specified B group vitamins: Secondary | ICD-10-CM | POA: Diagnosis not present

## 2016-10-23 DIAGNOSIS — H353221 Exudative age-related macular degeneration, left eye, with active choroidal neovascularization: Secondary | ICD-10-CM | POA: Diagnosis not present

## 2016-10-23 DIAGNOSIS — H353112 Nonexudative age-related macular degeneration, right eye, intermediate dry stage: Secondary | ICD-10-CM | POA: Diagnosis not present

## 2016-10-23 DIAGNOSIS — H353211 Exudative age-related macular degeneration, right eye, with active choroidal neovascularization: Secondary | ICD-10-CM | POA: Diagnosis not present

## 2016-10-24 DIAGNOSIS — H353211 Exudative age-related macular degeneration, right eye, with active choroidal neovascularization: Secondary | ICD-10-CM | POA: Diagnosis not present

## 2016-10-26 DIAGNOSIS — M150 Primary generalized (osteo)arthritis: Secondary | ICD-10-CM | POA: Diagnosis not present

## 2016-10-26 DIAGNOSIS — G5602 Carpal tunnel syndrome, left upper limb: Secondary | ICD-10-CM | POA: Diagnosis not present

## 2016-10-26 DIAGNOSIS — G5601 Carpal tunnel syndrome, right upper limb: Secondary | ICD-10-CM | POA: Diagnosis not present

## 2016-10-26 DIAGNOSIS — M0579 Rheumatoid arthritis with rheumatoid factor of multiple sites without organ or systems involvement: Secondary | ICD-10-CM | POA: Diagnosis not present

## 2016-10-26 DIAGNOSIS — R5383 Other fatigue: Secondary | ICD-10-CM | POA: Diagnosis not present

## 2016-10-26 DIAGNOSIS — E538 Deficiency of other specified B group vitamins: Secondary | ICD-10-CM | POA: Diagnosis not present

## 2016-10-26 DIAGNOSIS — R768 Other specified abnormal immunological findings in serum: Secondary | ICD-10-CM | POA: Diagnosis not present

## 2016-10-26 DIAGNOSIS — M2550 Pain in unspecified joint: Secondary | ICD-10-CM | POA: Diagnosis not present

## 2016-10-26 DIAGNOSIS — Z6823 Body mass index (BMI) 23.0-23.9, adult: Secondary | ICD-10-CM | POA: Diagnosis not present

## 2016-11-22 DIAGNOSIS — G5602 Carpal tunnel syndrome, left upper limb: Secondary | ICD-10-CM | POA: Diagnosis not present

## 2016-11-22 DIAGNOSIS — G5601 Carpal tunnel syndrome, right upper limb: Secondary | ICD-10-CM | POA: Diagnosis not present

## 2016-11-22 DIAGNOSIS — M0579 Rheumatoid arthritis with rheumatoid factor of multiple sites without organ or systems involvement: Secondary | ICD-10-CM | POA: Diagnosis not present

## 2016-11-22 DIAGNOSIS — R768 Other specified abnormal immunological findings in serum: Secondary | ICD-10-CM | POA: Diagnosis not present

## 2016-11-22 DIAGNOSIS — E538 Deficiency of other specified B group vitamins: Secondary | ICD-10-CM | POA: Diagnosis not present

## 2016-11-22 DIAGNOSIS — Z6822 Body mass index (BMI) 22.0-22.9, adult: Secondary | ICD-10-CM | POA: Diagnosis not present

## 2016-11-22 DIAGNOSIS — M150 Primary generalized (osteo)arthritis: Secondary | ICD-10-CM | POA: Diagnosis not present

## 2016-11-22 DIAGNOSIS — M2550 Pain in unspecified joint: Secondary | ICD-10-CM | POA: Diagnosis not present

## 2016-11-28 DIAGNOSIS — H353211 Exudative age-related macular degeneration, right eye, with active choroidal neovascularization: Secondary | ICD-10-CM | POA: Diagnosis not present

## 2016-12-11 DIAGNOSIS — H353221 Exudative age-related macular degeneration, left eye, with active choroidal neovascularization: Secondary | ICD-10-CM | POA: Diagnosis not present

## 2017-01-01 DIAGNOSIS — H353211 Exudative age-related macular degeneration, right eye, with active choroidal neovascularization: Secondary | ICD-10-CM | POA: Diagnosis not present

## 2017-02-05 DIAGNOSIS — H3562 Retinal hemorrhage, left eye: Secondary | ICD-10-CM | POA: Diagnosis not present

## 2017-02-05 DIAGNOSIS — H353221 Exudative age-related macular degeneration, left eye, with active choroidal neovascularization: Secondary | ICD-10-CM | POA: Diagnosis not present

## 2017-02-05 DIAGNOSIS — H353124 Nonexudative age-related macular degeneration, left eye, advanced atrophic with subfoveal involvement: Secondary | ICD-10-CM | POA: Diagnosis not present

## 2017-02-12 DIAGNOSIS — H353112 Nonexudative age-related macular degeneration, right eye, intermediate dry stage: Secondary | ICD-10-CM | POA: Diagnosis not present

## 2017-02-12 DIAGNOSIS — H35721 Serous detachment of retinal pigment epithelium, right eye: Secondary | ICD-10-CM | POA: Diagnosis not present

## 2017-02-12 DIAGNOSIS — H3562 Retinal hemorrhage, left eye: Secondary | ICD-10-CM | POA: Diagnosis not present

## 2017-02-12 DIAGNOSIS — H353212 Exudative age-related macular degeneration, right eye, with inactive choroidal neovascularization: Secondary | ICD-10-CM | POA: Diagnosis not present

## 2017-02-12 DIAGNOSIS — H353221 Exudative age-related macular degeneration, left eye, with active choroidal neovascularization: Secondary | ICD-10-CM | POA: Diagnosis not present

## 2017-03-26 DIAGNOSIS — H353124 Nonexudative age-related macular degeneration, left eye, advanced atrophic with subfoveal involvement: Secondary | ICD-10-CM | POA: Diagnosis not present

## 2017-03-26 DIAGNOSIS — H353211 Exudative age-related macular degeneration, right eye, with active choroidal neovascularization: Secondary | ICD-10-CM | POA: Diagnosis not present

## 2017-03-26 DIAGNOSIS — H353112 Nonexudative age-related macular degeneration, right eye, intermediate dry stage: Secondary | ICD-10-CM | POA: Diagnosis not present

## 2017-03-26 DIAGNOSIS — H35721 Serous detachment of retinal pigment epithelium, right eye: Secondary | ICD-10-CM | POA: Diagnosis not present

## 2017-03-26 DIAGNOSIS — H353222 Exudative age-related macular degeneration, left eye, with inactive choroidal neovascularization: Secondary | ICD-10-CM | POA: Diagnosis not present

## 2017-04-16 DIAGNOSIS — H35721 Serous detachment of retinal pigment epithelium, right eye: Secondary | ICD-10-CM | POA: Diagnosis not present

## 2017-04-16 DIAGNOSIS — H353124 Nonexudative age-related macular degeneration, left eye, advanced atrophic with subfoveal involvement: Secondary | ICD-10-CM | POA: Diagnosis not present

## 2017-04-16 DIAGNOSIS — H3562 Retinal hemorrhage, left eye: Secondary | ICD-10-CM | POA: Diagnosis not present

## 2017-04-16 DIAGNOSIS — H353211 Exudative age-related macular degeneration, right eye, with active choroidal neovascularization: Secondary | ICD-10-CM | POA: Diagnosis not present

## 2017-04-16 DIAGNOSIS — H353222 Exudative age-related macular degeneration, left eye, with inactive choroidal neovascularization: Secondary | ICD-10-CM | POA: Diagnosis not present

## 2017-04-22 DIAGNOSIS — R2689 Other abnormalities of gait and mobility: Secondary | ICD-10-CM | POA: Diagnosis not present

## 2017-04-22 DIAGNOSIS — R600 Localized edema: Secondary | ICD-10-CM | POA: Diagnosis not present

## 2017-04-22 DIAGNOSIS — E538 Deficiency of other specified B group vitamins: Secondary | ICD-10-CM | POA: Diagnosis not present

## 2017-04-22 DIAGNOSIS — I10 Essential (primary) hypertension: Secondary | ICD-10-CM | POA: Diagnosis not present

## 2017-04-25 ENCOUNTER — Telehealth: Payer: Self-pay | Admitting: Cardiology

## 2017-04-25 NOTE — Telephone Encounter (Signed)
Received records from Katie for appointment on 04/29/17 with Kerin Ransom, [place].  Records put with Luke's schedule for 04/29/17. lp

## 2017-04-29 ENCOUNTER — Encounter: Payer: Self-pay | Admitting: Cardiology

## 2017-04-29 ENCOUNTER — Ambulatory Visit (INDEPENDENT_AMBULATORY_CARE_PROVIDER_SITE_OTHER): Payer: PPO | Admitting: Cardiology

## 2017-04-29 VITALS — BP 158/85 | HR 73 | Ht 59.0 in | Wt 122.0 lb

## 2017-04-29 DIAGNOSIS — I10 Essential (primary) hypertension: Secondary | ICD-10-CM | POA: Diagnosis not present

## 2017-04-29 DIAGNOSIS — I5020 Unspecified systolic (congestive) heart failure: Secondary | ICD-10-CM

## 2017-04-29 DIAGNOSIS — I5031 Acute diastolic (congestive) heart failure: Secondary | ICD-10-CM | POA: Diagnosis not present

## 2017-04-29 DIAGNOSIS — I5043 Acute on chronic combined systolic (congestive) and diastolic (congestive) heart failure: Secondary | ICD-10-CM | POA: Insufficient documentation

## 2017-04-29 MED ORDER — POTASSIUM CHLORIDE CRYS ER 20 MEQ PO TBCR: 20.0000 meq | EXTENDED_RELEASE_TABLET | Freq: Every day | ORAL | 6 refills | Status: DC

## 2017-04-29 MED ORDER — FUROSEMIDE 40 MG PO TABS: 40.0000 mg | ORAL_TABLET | Freq: Every day | ORAL | 6 refills | Status: DC

## 2017-04-29 NOTE — Assessment & Plan Note (Signed)
Elevated BP

## 2017-04-29 NOTE — Progress Notes (Signed)
04/29/2017 Katherine Phillips   March 26, 1930  [id number]  Primary Physician [name], MD Primary Cardiologist: Dr Percival Spanish  HPI:  Pleasant 83 y/o AA female, lives in her own home, still drives, has two sons who help her around the house. She had seen dr Percival Spanish in the past for a murmur. Prior echo May 2017 showed an EF of 50% with moderate LVH and AOV sclerosis. She recently saw her PCP with complaints of edema.  A BNP was done and was elevated- 1200. The pt is referred now for further evaluation. She denies orthopnea. She is not particularly SOB. She does complain of generalized fatigue and LE edema. She is wearing support stockings.    Current Outpatient Prescriptions  Medication Sig Dispense Refill  . alendronate (FOSAMAX) 70 MG tablet Take 70 mg by mouth once a week.  11  . amLODipine (NORVASC) 5 MG tablet 1 TABLET ONCE A DAY ORALLY 30 DAYS  3  . carvedilol (COREG) 6.25 MG tablet Take 6.25 mg by mouth 2 (two) times daily.  1  . furosemide (LASIX) 20 MG tablet 1/2 TAB ONCE A DAY ORALLY 90 DAYS  1  . pravastatin (PRAVACHOL) 40 MG tablet Take 40 mg by mouth daily.  0   No current facility-administered medications for this visit.     No Known Allergies  Past Medical History:  Diagnosis Date  . CKD (chronic kidney disease)   . HTN (hypertension)   . Hyperlipidemia   . RA (rheumatoid arthritis) ([place])     Social History   Social History  . Marital status: Widowed    Spouse name: N/A  . Number of children: 5  . Years of education: N/A   Occupational History  . Not on file.   Social History Main Topics  . Smoking status: Never Smoker  . Smokeless tobacco: Never Used  . Alcohol use Not on file  . Drug use: Unknown  . Sexual activity: Not on file   Other Topics Concern  . Not on file   Social History Narrative   Lives alone.       Family History  Problem Relation Age of Onset  . Hypertension Mother   . Cancer Sister        Unknown     Review of  Systems: General: negative for chills, fever, night sweats or weight changes.  Cardiovascular: negative for chest pain, dyspnea on exertion, orthopnea, palpitations, paroxysmal nocturnal dyspnea or shortness of breath Dermatological: negative for rash Respiratory: negative for cough or wheezing Urologic: negative for hematuria Abdominal: negative for nausea, vomiting, diarrhea, bright red blood per rectum, melena, or hematemesis Neurologic: negative for visual changes, syncope, or dizziness All other systems reviewed and are otherwise negative except as noted above.    Blood pressure (!) 158/85, pulse 73, height 4\' 11"  (1.499 m), weight 122 lb (55.3 kg).  General appearance: alert, cooperative and no distress Neck: she has JVD to the angle of her jaw when at 20 degrees Lungs: few crackles Lt base Heart: regular rate and rhythm Abdomen: soft, non-tender; bowel sounds normal; no masses,  no organomegaly and mid line surgical scar Extremities: trace to 1+ non pitting edema both LE Pulses: 2+ and symmetric Skin: Skin color, texture, turgor normal. No rashes or lesions Neurologic: Grossly normal  EKG NSR, LAD, LVH  ASSESSMENT AND PLAN:   Acute diastolic (congestive) heart failure (HCC) Pt referred for symptoms of fatigue, LE edema, and an elevated BNP (1200)  HTN (hypertension) Elevated B/P  PLAN  I suggested she increase her Lasix to 40 mg daily from 20 mg daily and add K+ 20 meq daily. We discussed low sodium diet (she has been eating canned soups). Check echo and BNP, BMP next week.   Kerin Ransom PA-C 04/29/2017 10:45 AM

## 2017-04-29 NOTE — Patient Instructions (Addendum)
Schedule Echocardiogram   Lab to be done same day as Echocardiogram  ( bnp,bmet )  You may eat   Take Lasix  ( Furosemide ) 40 mg daily   Take KDur  20 meq daily    Your physician recommends that you schedule a follow-up appointment with Dr.Hochrein in 6 weeks  after Echo

## 2017-04-29 NOTE — Assessment & Plan Note (Signed)
Pt referred for symptoms of fatigue, LE edema, and an elevated BNP (1200)

## 2017-04-30 DIAGNOSIS — H353211 Exudative age-related macular degeneration, right eye, with active choroidal neovascularization: Secondary | ICD-10-CM | POA: Diagnosis not present

## 2017-05-14 ENCOUNTER — Ambulatory Visit (HOSPITAL_COMMUNITY): Payer: PPO | Attending: Cardiovascular Disease

## 2017-05-14 ENCOUNTER — Other Ambulatory Visit: Payer: Self-pay

## 2017-05-14 ENCOUNTER — Other Ambulatory Visit: Payer: PPO | Admitting: *Deleted

## 2017-05-14 DIAGNOSIS — I088 Other rheumatic multiple valve diseases: Secondary | ICD-10-CM | POA: Diagnosis not present

## 2017-05-14 DIAGNOSIS — I10 Essential (primary) hypertension: Secondary | ICD-10-CM

## 2017-05-14 DIAGNOSIS — E785 Hyperlipidemia, unspecified: Secondary | ICD-10-CM | POA: Diagnosis not present

## 2017-05-14 DIAGNOSIS — I080 Rheumatic disorders of both mitral and aortic valves: Secondary | ICD-10-CM | POA: Diagnosis not present

## 2017-05-14 DIAGNOSIS — I110 Hypertensive heart disease with heart failure: Secondary | ICD-10-CM | POA: Diagnosis not present

## 2017-05-14 DIAGNOSIS — I5031 Acute diastolic (congestive) heart failure: Secondary | ICD-10-CM

## 2017-05-15 LAB — BASIC METABOLIC PANEL
BUN/Creatinine Ratio: 18 (ref 12–28)
BUN: 22 mg/dL (ref 8–27)
CO2: 26 mmol/L (ref 20–29)
Calcium: 10.1 mg/dL (ref 8.7–10.3)
Chloride: 106 mmol/L (ref 96–106)
Creatinine, Ser: 1.21 mg/dL — ABNORMAL HIGH (ref 0.57–1.00)
GFR calc Af Amer: 47 mL/min/{1.73_m2} — ABNORMAL LOW (ref >59)
GFR calc non Af Amer: 41 mL/min/{1.73_m2} — ABNORMAL LOW (ref >59)
Glucose: 54 mg/dL — ABNORMAL LOW (ref 65–99)
Potassium: 4.8 mmol/L (ref 3.5–5.2)
Sodium: 146 mmol/L — ABNORMAL HIGH (ref 134–144)

## 2017-05-15 LAB — PRO B NATRIURETIC PEPTIDE: NT-Pro BNP: 4043 pg/mL — ABNORMAL HIGH (ref 0–738)

## 2017-05-16 ENCOUNTER — Telehealth: Payer: Self-pay | Admitting: *Deleted

## 2017-05-16 MED ORDER — SPIRONOLACTONE 25 MG PO TABS: 12.5000 mg | ORAL_TABLET | Freq: Every day | ORAL | 3 refills | Status: DC

## 2017-05-16 MED ORDER — LOSARTAN POTASSIUM 25 MG PO TABS: 25.0000 mg | ORAL_TABLET | Freq: Every day | ORAL | 3 refills | Status: DC

## 2017-05-16 NOTE — Telephone Encounter (Signed)
-----   Message from Erlene Quan, [place] sent at 05/15/2017  1:31 PM EDT ----- Pt's EF depressed, K+ 4.8, BNP elevated. Please ask Ms Bureau to stop Norvasc and start Cozaar 25 mg. She should stop K+ and start Aldactone 12.5 mg daily. I should probably see her in the next week if possible.  Thanks, [employer]

## 2017-05-16 NOTE — Telephone Encounter (Signed)
Currently I cannot see openings. I have requested scheduler to look for open spots/cancellations and let me/patient know.

## 2017-05-16 NOTE — Telephone Encounter (Signed)
Pt has been scheduled for 9/5 f/u

## 2017-05-16 NOTE — Telephone Encounter (Signed)
Overbooking is a last resort- OK to push appointment to two weeks if needed  Kerin Ransom PA-C 05/16/2017 3:01 PM

## 2017-05-16 NOTE — Telephone Encounter (Signed)
Left msg for patient to call. 

## 2017-05-16 NOTE — Telephone Encounter (Signed)
Patient called back. Instructions relayed, to which she voiced understanding. She does understand recommendation for f/u appt. Aware that I have also checked for upcoming availability on Luke's schedule next week. Will see if OK to [facility] on provider schedule.

## 2017-05-29 ENCOUNTER — Ambulatory Visit: Payer: PPO | Admitting: Cardiology

## 2017-05-29 DIAGNOSIS — I429 Cardiomyopathy, unspecified: Secondary | ICD-10-CM | POA: Insufficient documentation

## 2017-05-29 DIAGNOSIS — N183 Chronic kidney disease, stage 3 unspecified: Secondary | ICD-10-CM | POA: Insufficient documentation

## 2017-06-11 DIAGNOSIS — H35721 Serous detachment of retinal pigment epithelium, right eye: Secondary | ICD-10-CM | POA: Diagnosis not present

## 2017-06-11 DIAGNOSIS — H3562 Retinal hemorrhage, left eye: Secondary | ICD-10-CM | POA: Diagnosis not present

## 2017-06-11 DIAGNOSIS — H353211 Exudative age-related macular degeneration, right eye, with active choroidal neovascularization: Secondary | ICD-10-CM | POA: Diagnosis not present

## 2017-06-11 DIAGNOSIS — H353222 Exudative age-related macular degeneration, left eye, with inactive choroidal neovascularization: Secondary | ICD-10-CM | POA: Diagnosis not present

## 2017-06-19 ENCOUNTER — Encounter: Payer: Self-pay | Admitting: Cardiology

## 2017-06-27 NOTE — Progress Notes (Signed)
Cardiology Office Note   Date:  06/30/2017   ID:  Katherine Phillips, DOB 1930/02/24, MRN [id number]  PCP:  Katherine Ada, MD  Cardiologist:   Minus Breeding, MD   Chief Complaint  Patient presents with  . Cardiomyopathy      History of Present Illness: Katherine Phillips is a 83 y.o. female who presents for evaluation of heart murmur.  2012 echo had demonstrated some mild AS.  She did not come back at that time for the follow up echo.  I saw her earlier this year with SOB and a mildly elevated BNP.  I sent her for an echo.  There was mild aortic sclerosis.  EF is low normal.  There were renal cysts.  Dedicated renal ultrasound confirmed the cysts although the images were suboptimal   CT follow up demonstreated the cysts and she was referred back to Katherine Ada, MD for any necessary further imaging or treatment.  She did have an increased BNP recently although she was not particularly symptomatic.  She had her Lasix increased.   She did have a follow up echo in August and her EF was reduced to 35%.  Her Norvasc and potassium were stopped and she has been treated with spironolactone and Cozaar.  She is fatigued.  However, the patient denies any new symptoms such as chest discomfort, neck or arm discomfort. There have been no reported palpitations, presyncope or syncope.    Past Medical History:  Diagnosis Date  . CKD (chronic kidney disease)   . HTN (hypertension)   . Hyperlipidemia   . RA (rheumatoid arthritis) ([place])     Past Surgical History:  Procedure Laterality Date  . ABDOMINAL HYSTERECTOMY    . CATARACT EXTRACTION    . HIATAL HERNIA REPAIR    . TUBAL LIGATION       Current Outpatient Prescriptions  Medication Sig Dispense Refill  . alendronate (FOSAMAX) 70 MG tablet Take 70 mg by mouth once a week.  11  . carvedilol (COREG) 6.25 MG tablet Take 6.25 mg by mouth 2 (two) times daily.  1  . furosemide (LASIX) 40 MG tablet Take 1 tablet (40 mg total) by mouth daily. 30  tablet 6  . losartan (COZAAR) 50 MG tablet Take 1 tablet (50 mg total) by mouth daily. 30 tablet 11  . pravastatin (PRAVACHOL) 40 MG tablet Take 40 mg by mouth daily.  0  . spironolactone (ALDACTONE) 25 MG tablet Take 0.5 tablets (12.5 mg total) by mouth daily. 30 tablet 3   No current facility-administered medications for this visit.     Allergies:   Patient has no known allergies.    ROS:  Please see the history of present illness.   Otherwise, review of systems are positive for none.   All other systems are reviewed and negative.    PHYSICAL EXAM: VS:  BP (!) 146/77   Pulse 67   Ht 4\' 11"  (1.499 m)   Wt 115 lb (52.2 kg)   SpO2 99%   BMI 23.23 kg/m  , BMI Body mass index is 23.23 kg/m.  GENERAL:  Well appearing NECK:  No jugular venous distention, waveform within normal limits, carotid upstroke brisk and symmetric, no bruits, no thyromegaly LUNGS:  Clear to auscultation bilaterally CHEST:  Unremarkable HEART:  PMI not displaced or sustained,S1 and S2 within normal limits, no S3, no S4, no clicks, no rubs, 2 out of 6 apical systolic murmur early peaking, no diastolic murmurs ABD:  Flat,  positive bowel sounds normal in frequency in pitch, no bruits, no rebound, no guarding, no midline pulsatile mass, no hepatomegaly, no splenomegaly EXT:  2 plus pulses throughout, no edema, no cyanosis no clubbing      EKG:  EKG is not  ordered today.   Recent Labs: 05/14/2017: NT-Pro BNP 4,043 06/28/2017: BUN 27; Creatinine, Ser 1.79; Hemoglobin 13.8; Platelets 194; Potassium 4.4; Sodium 141    Lipid Panel No results found for: CHOL, TRIG, HDL, CHOLHDL, VLDL, LDLCALC, LDLDIRECT    Wt Readings from Last 3 Encounters:  06/28/17 115 lb (52.2 kg)  04/29/17 122 lb (55.3 kg)  05/14/16 116 lb (52.6 kg)      Other studies Reviewed: Additional studies/ records that were reviewed today include: Echo  Review of the above records demonstrates:  Please see elsewhere in the note.      ASSESSMENT AND PLAN:  CARDIOMYOPATHY:  I will increase the Cozaar to 50 mg daily.    She needs a BMET.  I will order this today.   RENAL CYSTS:   This can be followed by her PCP.   Going forward.    HTN:   This is being managed in the context of treating his CHF  FATIGUE:   I will check a CBC.    EDEMA:  This is mild and improved.  No change in therapy is planned.   Current medicines are reviewed at length with the patient today.  The patient does not have concerns regarding medicines.  The following changes have been made:   As above  Labs/ tests ordered today include:    Orders Placed This Encounter  Procedures  . Basic Metabolic Panel (BMET)  . CBC     Disposition:   FU with me Kerin Ransom in two months.  Ronnell Guadalajara, MD  06/30/2017 12:28 PM    [facility]

## 2017-06-28 ENCOUNTER — Ambulatory Visit (INDEPENDENT_AMBULATORY_CARE_PROVIDER_SITE_OTHER): Payer: PPO | Admitting: Cardiology

## 2017-06-28 ENCOUNTER — Encounter: Payer: Self-pay | Admitting: Cardiology

## 2017-06-28 VITALS — BP 146/77 | HR 67 | Ht 59.0 in | Wt 115.0 lb

## 2017-06-28 DIAGNOSIS — M7989 Other specified soft tissue disorders: Secondary | ICD-10-CM | POA: Diagnosis not present

## 2017-06-28 DIAGNOSIS — Z79899 Other long term (current) drug therapy: Secondary | ICD-10-CM

## 2017-06-28 DIAGNOSIS — R5383 Other fatigue: Secondary | ICD-10-CM | POA: Diagnosis not present

## 2017-06-28 DIAGNOSIS — I10 Essential (primary) hypertension: Secondary | ICD-10-CM | POA: Diagnosis not present

## 2017-06-28 MED ORDER — LOSARTAN POTASSIUM 50 MG PO TABS: 50.0000 mg | ORAL_TABLET | Freq: Every day | ORAL | 11 refills | Status: DC

## 2017-06-28 NOTE — Patient Instructions (Addendum)
Medication Instructions:  INCREASE- Losartan 50 mg daily  If you need a refill on your cardiac medications before your next appointment, please call your pharmacy.  Labwork: BMP and CBC HERE IN OUR OFFICE AT [facility]  Testing/Procedures: None Ordered  Follow-Up: Your physician wants you to follow-up in: 2 Months with Kerin Ransom.    Thank you for choosing [facility] at [facility]!!

## 2017-06-29 LAB — BASIC METABOLIC PANEL
BUN / CREAT RATIO: 15 (ref 12–28)
BUN: 27 mg/dL (ref 8–27)
CO2: 27 mmol/L (ref 20–29)
CREATININE: 1.79 mg/dL — AB (ref 0.57–1.00)
Calcium: 9.3 mg/dL (ref 8.7–10.3)
Chloride: 102 mmol/L (ref 96–106)
GFR calc Af Amer: 29 mL/min/{1.73_m2} — ABNORMAL LOW (ref >59)
GFR, EST NON AFRICAN AMERICAN: 25 mL/min/{1.73_m2} — AB (ref >59)
Glucose: 83 mg/dL (ref 65–99)
Potassium: 4.4 mmol/L (ref 3.5–5.2)
SODIUM: 141 mmol/L (ref 134–144)

## 2017-06-29 LAB — CBC
HEMATOCRIT: 42.6 % (ref 34.0–46.6)
Hemoglobin: 13.8 g/dL (ref 11.1–15.9)
MCH: 31.1 pg (ref 26.6–33.0)
MCHC: 32.4 g/dL (ref 31.5–35.7)
MCV: 96 fL (ref 79–97)
PLATELETS: 194 10*3/uL (ref 150–379)
RBC: 4.44 x10E6/uL (ref 3.77–5.28)
RDW: 15 % (ref 12.3–15.4)
WBC: 3.1 10*3/uL — ABNORMAL LOW (ref 3.4–10.8)

## 2017-06-30 ENCOUNTER — Encounter: Payer: Self-pay | Admitting: Cardiology

## 2017-06-30 DIAGNOSIS — I10 Essential (primary) hypertension: Secondary | ICD-10-CM | POA: Insufficient documentation

## 2017-06-30 DIAGNOSIS — M7989 Other specified soft tissue disorders: Secondary | ICD-10-CM | POA: Insufficient documentation

## 2017-06-30 DIAGNOSIS — Z79899 Other long term (current) drug therapy: Secondary | ICD-10-CM | POA: Insufficient documentation

## 2017-07-01 DIAGNOSIS — I10 Essential (primary) hypertension: Secondary | ICD-10-CM | POA: Diagnosis not present

## 2017-07-04 ENCOUNTER — Telehealth: Payer: Self-pay | Admitting: *Deleted

## 2017-07-04 DIAGNOSIS — Z79899 Other long term (current) drug therapy: Secondary | ICD-10-CM

## 2017-07-04 NOTE — Telephone Encounter (Signed)
Pt aware of her blood work, BMP ordered for pt to get done on Monday, advised pt to walk in  Office and let the front desk know she is here for blood work.

## 2017-07-04 NOTE — Telephone Encounter (Signed)
-----   Message from Minus Breeding, MD sent at 07/02/2017  1:13 PM EDT ----- Creat is elevated please repeat BMET in one week.  Call Ms. Leibold with the results and send results to [name], MD

## 2017-07-16 DIAGNOSIS — H35721 Serous detachment of retinal pigment epithelium, right eye: Secondary | ICD-10-CM | POA: Diagnosis not present

## 2017-07-16 DIAGNOSIS — H353212 Exudative age-related macular degeneration, right eye, with inactive choroidal neovascularization: Secondary | ICD-10-CM | POA: Diagnosis not present

## 2017-07-16 DIAGNOSIS — H353112 Nonexudative age-related macular degeneration, right eye, intermediate dry stage: Secondary | ICD-10-CM | POA: Diagnosis not present

## 2017-07-16 DIAGNOSIS — H353222 Exudative age-related macular degeneration, left eye, with inactive choroidal neovascularization: Secondary | ICD-10-CM | POA: Diagnosis not present

## 2017-07-16 DIAGNOSIS — H3562 Retinal hemorrhage, left eye: Secondary | ICD-10-CM | POA: Diagnosis not present

## 2017-09-05 DIAGNOSIS — H353222 Exudative age-related macular degeneration, left eye, with inactive choroidal neovascularization: Secondary | ICD-10-CM | POA: Diagnosis not present

## 2017-09-05 DIAGNOSIS — H35721 Serous detachment of retinal pigment epithelium, right eye: Secondary | ICD-10-CM | POA: Diagnosis not present

## 2017-09-05 DIAGNOSIS — H353211 Exudative age-related macular degeneration, right eye, with active choroidal neovascularization: Secondary | ICD-10-CM | POA: Diagnosis not present

## 2017-09-05 DIAGNOSIS — H3562 Retinal hemorrhage, left eye: Secondary | ICD-10-CM | POA: Diagnosis not present

## 2017-09-12 ENCOUNTER — Ambulatory Visit: Payer: PPO | Admitting: Cardiology

## 2017-09-12 ENCOUNTER — Encounter: Payer: Self-pay | Admitting: Cardiology

## 2017-09-12 VITALS — BP 129/70 | HR 76 | Ht <= 58 in | Wt 118.6 lb

## 2017-09-12 DIAGNOSIS — I5042 Chronic combined systolic (congestive) and diastolic (congestive) heart failure: Secondary | ICD-10-CM | POA: Diagnosis not present

## 2017-09-12 DIAGNOSIS — I110 Hypertensive heart disease with heart failure: Secondary | ICD-10-CM

## 2017-09-12 DIAGNOSIS — N289 Disorder of kidney and ureter, unspecified: Secondary | ICD-10-CM | POA: Diagnosis not present

## 2017-09-12 DIAGNOSIS — I5022 Chronic systolic (congestive) heart failure: Secondary | ICD-10-CM

## 2017-09-12 DIAGNOSIS — I5031 Acute diastolic (congestive) heart failure: Secondary | ICD-10-CM | POA: Diagnosis not present

## 2017-09-12 DIAGNOSIS — I10 Essential (primary) hypertension: Secondary | ICD-10-CM

## 2017-09-12 LAB — BASIC METABOLIC PANEL
BUN/Creatinine Ratio: 18 (ref 12–28)
BUN: 28 mg/dL — ABNORMAL HIGH (ref 8–27)
CO2: 26 mmol/L (ref 20–29)
Calcium: 10.3 mg/dL (ref 8.7–10.3)
Chloride: 101 mmol/L (ref 96–106)
Creatinine, Ser: 1.54 mg/dL — ABNORMAL HIGH (ref 0.57–1.00)
GFR calc Af Amer: 35 mL/min/{1.73_m2} — ABNORMAL LOW (ref >59)
GFR calc non Af Amer: 31 mL/min/{1.73_m2} — ABNORMAL LOW (ref >59)
Glucose: 86 mg/dL (ref 65–99)
Potassium: 4.6 mmol/L (ref 3.5–5.2)
Sodium: 139 mmol/L (ref 134–144)

## 2017-09-12 LAB — BRAIN NATRIURETIC PEPTIDE: BNP: 422.8 pg/mL — ABNORMAL HIGH (ref 0.0–100.0)

## 2017-09-12 NOTE — Progress Notes (Signed)
09/12/2017 Katherine Phillips   [birth year]  [id number]  Primary Physician [name], MD Primary Cardiologist: Dr Percival Spanish  HPI:  83 y/o AA female, lives in her own home, still drives, has two sons who help her around the house. She had seen Dr Percival Spanish in the past for a murmur. Prior echo May 2017 showed an EF of 50% with moderate LVH and AOV sclerosis. She saw her PCP this past summer and complained of LE edema.  A BNP was done and was elevated- 1200. The pt was referred to [place] for further evaluation. Echo done 05/14/17 showed her EF to be 35-40% with grade 3 DD. Aldactone and Cozaar were added. Dr Percival Spanish saw her in October and increased her Cozaar. She had labs then that showed her SCr had gone up to 1.7.   She is in the office today for a 2 month f/u. She denies orthopnea. She is not particularly SOB. She denies syncope or near syncope. She does complain of generalized fatigue. Her LE edema has resolved. We discussed low sodium diet at her LOV but she tells me "I love canned soups".    Current Outpatient Medications  Medication Sig Dispense Refill  . alendronate (FOSAMAX) 70 MG tablet Take 70 mg by mouth once a week.  11  . carvedilol (COREG) 6.25 MG tablet Take 6.25 mg by mouth 2 (two) times daily.  1  . furosemide (LASIX) 40 MG tablet Take 1 tablet (40 mg total) by mouth daily. 30 tablet 6  . losartan (COZAAR) 50 MG tablet Take 1 tablet (50 mg total) by mouth daily. 30 tablet 11  . pravastatin (PRAVACHOL) 40 MG tablet Take 40 mg by mouth daily.  0  . spironolactone (ALDACTONE) 25 MG tablet Take 0.5 tablets (12.5 mg total) by mouth daily. 30 tablet 3   No current facility-administered medications for this visit.     No Known Allergies  Past Medical History:  Diagnosis Date  . CKD (chronic kidney disease)   . HTN (hypertension)   . Hyperlipidemia   . RA (rheumatoid arthritis) (HCC)     Social History   Socioeconomic History  . Marital status: Widowed    Spouse name:  Not on file  . Number of children: 5  . Years of education: Not on file  . Highest education level: Not on file  Social Needs  . Financial resource strain: Not on file  . Food insecurity - worry: Not on file  . Food insecurity - inability: Not on file  . Transportation needs - medical: Not on file  . Transportation needs - non-medical: Not on file  Occupational History  . Not on file  Tobacco Use  . Smoking status: Never Smoker  . Smokeless tobacco: Never Used  Substance and Sexual Activity  . Alcohol use: No    Alcohol/week: 0.0 oz  . Drug use: No  . Sexual activity: Not on file  Other Topics Concern  . Not on file  Social History Narrative   Lives alone.       Family History  Problem Relation Age of Onset  . Hypertension Mother   . Cancer Sister        Unknown     Review of Systems: General: negative for chills, fever, night sweats or weight changes.  Cardiovascular: negative for chest pain, dyspnea on exertion, edema, orthopnea, palpitations, paroxysmal nocturnal dyspnea or shortness of breath Dermatological: negative for rash Respiratory: negative for cough or wheezing Urologic: negative for hematuria  Abdominal: negative for nausea, vomiting, diarrhea, bright red blood per rectum, melena, or hematemesis Neurologic: negative for visual changes, syncope, or dizziness All other systems reviewed and are otherwise negative except as noted above.    Blood pressure 129/70, pulse 76, height 4\' 10"  (1.473 m), weight 118 lb 9.6 oz (53.8 kg).  General appearance: alert, cooperative and no distress Neck: no carotid bruit and no JVD Lungs: clear to auscultation bilaterally Heart: regular rate and rhythm and soft systolic murmur AOV and LSB Extremities: extremities normal, atraumatic, no cyanosis or edema Skin: Skin color, texture, turgor normal. No rashes or lesions Neurologic: Grossly normal   ASSESSMENT AND PLAN:   Chronic combined systolic and diastolic  CHF Currently stable  HTN with HCVD B/P controlled on increased Cozaar  Renal insufficiency SCr 1.7 in Oct- will re check  Plan: We again discussed the importance of low sodium diet. She said she will start making her soup from scratch. I ordered a BNP and BMP today, we'll make medication adjustments as needed based on her labs. It could be that her baseline SCr is 1.5-1.8.    Kerin Ransom PA-C 09/12/2017 10:27 AM

## 2017-09-12 NOTE — Patient Instructions (Signed)
Continue same medications    Lab work today  ( bmet,bnp )    Your physician wants you to follow-up in: 6 months with Dr.Hochrein. You will receive a reminder letter in the mail two months in advance. If you don't receive a letter, please call our office to schedule the follow-up appointment.

## 2017-10-03 DIAGNOSIS — R531 Weakness: Secondary | ICD-10-CM | POA: Diagnosis not present

## 2017-10-03 DIAGNOSIS — R2689 Other abnormalities of gait and mobility: Secondary | ICD-10-CM | POA: Diagnosis not present

## 2017-10-03 DIAGNOSIS — I10 Essential (primary) hypertension: Secondary | ICD-10-CM | POA: Diagnosis not present

## 2017-10-03 DIAGNOSIS — I5020 Unspecified systolic (congestive) heart failure: Secondary | ICD-10-CM | POA: Diagnosis not present

## 2017-10-17 DIAGNOSIS — H353222 Exudative age-related macular degeneration, left eye, with inactive choroidal neovascularization: Secondary | ICD-10-CM | POA: Diagnosis not present

## 2017-10-17 DIAGNOSIS — H353211 Exudative age-related macular degeneration, right eye, with active choroidal neovascularization: Secondary | ICD-10-CM | POA: Diagnosis not present

## 2017-10-17 DIAGNOSIS — H43812 Vitreous degeneration, left eye: Secondary | ICD-10-CM | POA: Diagnosis not present

## 2017-10-17 DIAGNOSIS — H43811 Vitreous degeneration, right eye: Secondary | ICD-10-CM | POA: Diagnosis not present

## 2017-10-17 DIAGNOSIS — H35361 Drusen (degenerative) of macula, right eye: Secondary | ICD-10-CM | POA: Diagnosis not present

## 2017-11-04 DIAGNOSIS — R944 Abnormal results of kidney function studies: Secondary | ICD-10-CM | POA: Diagnosis not present

## 2017-11-22 DIAGNOSIS — N183 Chronic kidney disease, stage 3 (moderate): Secondary | ICD-10-CM | POA: Diagnosis not present

## 2017-11-22 DIAGNOSIS — I129 Hypertensive chronic kidney disease with stage 1 through stage 4 chronic kidney disease, or unspecified chronic kidney disease: Secondary | ICD-10-CM | POA: Diagnosis not present

## 2017-12-05 DIAGNOSIS — H353211 Exudative age-related macular degeneration, right eye, with active choroidal neovascularization: Secondary | ICD-10-CM | POA: Diagnosis not present

## 2017-12-05 DIAGNOSIS — H353222 Exudative age-related macular degeneration, left eye, with inactive choroidal neovascularization: Secondary | ICD-10-CM | POA: Diagnosis not present

## 2017-12-05 DIAGNOSIS — H35721 Serous detachment of retinal pigment epithelium, right eye: Secondary | ICD-10-CM | POA: Diagnosis not present

## 2017-12-05 DIAGNOSIS — H43811 Vitreous degeneration, right eye: Secondary | ICD-10-CM | POA: Diagnosis not present

## 2017-12-09 ENCOUNTER — Other Ambulatory Visit: Payer: Self-pay | Admitting: Cardiology

## 2018-01-20 DIAGNOSIS — H3562 Retinal hemorrhage, left eye: Secondary | ICD-10-CM | POA: Diagnosis not present

## 2018-01-20 DIAGNOSIS — H353211 Exudative age-related macular degeneration, right eye, with active choroidal neovascularization: Secondary | ICD-10-CM | POA: Diagnosis not present

## 2018-01-20 DIAGNOSIS — H43811 Vitreous degeneration, right eye: Secondary | ICD-10-CM | POA: Diagnosis not present

## 2018-01-20 DIAGNOSIS — H353112 Nonexudative age-related macular degeneration, right eye, intermediate dry stage: Secondary | ICD-10-CM | POA: Diagnosis not present

## 2018-01-28 ENCOUNTER — Encounter: Payer: Self-pay | Admitting: Cardiology

## 2018-01-28 DIAGNOSIS — R399 Unspecified symptoms and signs involving the genitourinary system: Secondary | ICD-10-CM | POA: Diagnosis not present

## 2018-01-28 DIAGNOSIS — R413 Other amnesia: Secondary | ICD-10-CM | POA: Diagnosis not present

## 2018-03-17 DIAGNOSIS — I5020 Unspecified systolic (congestive) heart failure: Secondary | ICD-10-CM | POA: Diagnosis not present

## 2018-03-17 DIAGNOSIS — I10 Essential (primary) hypertension: Secondary | ICD-10-CM | POA: Diagnosis not present

## 2018-03-17 DIAGNOSIS — I350 Nonrheumatic aortic (valve) stenosis: Secondary | ICD-10-CM | POA: Diagnosis not present

## 2018-03-18 ENCOUNTER — Other Ambulatory Visit: Payer: Self-pay | Admitting: Cardiology

## 2018-03-19 NOTE — Progress Notes (Signed)
Cardiology Office Note   Date:  03/20/2018   ID:  Katherine Phillips, DOB [birth year], MRN [id number]  PCP:  [name], MD  Cardiologist:   Minus Breeding, MD   Chief Complaint  Patient presents with  . Edema  . Shortness of Breath      History of Present Illness: Katherine Phillips is a 84 y.o. female who presents for evaluation of heart murmur.  2012 echo had demonstrated some mild AS.  She did not come back at that time for the follow up echo.  I saw her last year with SOB and a mildly elevated BNP.  I sent her for an echo.  There was mild aortic sclerosis.  EF was low normal.  There were renal cysts.  Dedicated renal ultrasound confirmed the cysts although the images were suboptimal   CT follow up demonstreated the cysts and she was referred back to [name], MD for any necessary further imaging or treatment.  She did have a follow up echo in August and her EF was reduced to 35%.  Her Norvasc and potassium were stopped and she has been treated with spironolactone and Cozaar.   She was referred back by her primary provider today because she is had some increased leg swelling.  She did have a slightly elevated BNP in December.  She said the current leg swelling has been going on for about a week.  Her weights are stable.  She feels like she might have a little extra fluid in that it is difficult to tighten her bra.  She is a little more short of breath with physical activity such as vacuuming or mopping.  She is not describing PND or orthopnea.  She is not describing chest pressure, neck or arm discomfort.  Her diet sounds like it is pretty good but she still gets some [employer].  She did not start cooking her soup scratch.    Past Medical History:  Diagnosis Date  . CKD (chronic kidney disease)   . HTN (hypertension)   . Hyperlipidemia   . RA (rheumatoid arthritis) (HCC)      Current Outpatient Medications  Medication Sig Dispense Refill  . alendronate (FOSAMAX) 70 MG tablet  Take 70 mg by mouth once a week.  11  . carvedilol (COREG) 6.25 MG tablet Take 6.25 mg by mouth 2 (two) times daily.  1  . KLOR-CON M20 20 MEQ tablet TAKE 1 TABLET BY MOUTH EVERY DAY 30 tablet 6  . pravastatin (PRAVACHOL) 40 MG tablet Take 40 mg by mouth daily.  0  . spironolactone (ALDACTONE) 25 MG tablet TAKE 1/2 TABLET BY MOUTH DAILY 30 tablet 6  . furosemide (LASIX) 40 MG tablet Take 1 tablet (40 mg total) by mouth 2 (two) times daily. 60 tablet 11  . losartan (COZAAR) 50 MG tablet Take 1 tablet (50 mg total) by mouth daily. 30 tablet 11   No current facility-administered medications for this visit.     Allergies:   Patient has no known allergies.    ROS:  Please see the history of present illness.   Otherwise, review of systems are positive for none.   All other systems are reviewed and negative.    PHYSICAL EXAM: VS:  BP 134/66 (BP Location: Left Arm, Patient Position: Sitting, Cuff Size: Normal)   Pulse 70   Ht 4\' 10"  (1.473 m)   Wt 120 lb (54.4 kg)   BMI 25.08 kg/m  , BMI Body mass index is  25.08 kg/m.   GENERAL:  Well appearing NECK:  No jugular venous distention, waveform within normal limits, carotid upstroke brisk and symmetric, no bruits, no thyromegaly LUNGS:  Clear to auscultation bilaterally CHEST:  Unremarkable HEART:  PMI not displaced or sustained,S1 and S2 within normal limits, no S3, no S4, no clicks, no rubs, 2/6 apical systolic murmur no diastolic  murmurs ABD:  Flat, positive bowel sounds normal in frequency in pitch, no bruits, no rebound, no guarding, no midline pulsatile mass, no hepatomegaly, no splenomegaly EXT:  2 plus pulses throughout, mild edema, no cyanosis no clubbing, missing index finger right hand.     EKG:  EKG is ordered today. Sinus rhythm, rate 70, left ventricular hypertrophy by voltage criteria, left axis deviation, no acute ST-T wave changes.  Recent Labs: 05/14/2017: NT-Pro BNP 4,043 06/28/2017: Hemoglobin 13.8; Platelets  194 09/12/2017: BNP 422.8; BUN 28; Creatinine, Ser 1.54; Potassium 4.6; Sodium 139    Lipid Panel No results found for: CHOL, TRIG, HDL, CHOLHDL, VLDL, LDLCALC, LDLDIRECT    Wt Readings from Last 3 Encounters:  03/20/18 120 lb (54.4 kg)  09/12/17 118 lb 9.6 oz (53.8 kg)  06/28/17 115 lb (52.2 kg)      Other studies Reviewed: Additional studies/ records that were reviewed today include: Labs  Review of the above records demonstrates:     ASSESSMENT AND PLAN:  CARDIOMYOPATHY:   I would like to increase her medications.  However, I think she had trouble moving to 9.375 mg twice daily of carvedilol but this would be more complex.  Therefore, not to change this.  She will be on the meds as listed except I am going to give her Lasix 40 mg twice daily and get her an appointment for 1 week to be seen and have a basic metabolic profile.   HTN:   This is being managed in the context of treating his CHF .  She might be able to have her Cozaar increased at the next visit.    EDEMA:  This will be treated as above.   Current medicines are reviewed at length with the patient today.  The patient does not have concerns regarding medicines.  The following changes have been made:   As above Labs/ tests ordered today include:    Orders Placed This Encounter  Procedures  . EKG 12-Lead     Disposition:   FU with APP in one week.     Signed, Minus Breeding, MD  03/20/2018 2:14 PM    [facility]

## 2018-03-20 ENCOUNTER — Encounter: Payer: Self-pay | Admitting: Cardiology

## 2018-03-20 ENCOUNTER — Ambulatory Visit: Payer: PPO | Admitting: Cardiology

## 2018-03-20 VITALS — BP 134/66 | HR 70 | Ht <= 58 in | Wt 120.0 lb

## 2018-03-20 DIAGNOSIS — I5042 Chronic combined systolic (congestive) and diastolic (congestive) heart failure: Secondary | ICD-10-CM

## 2018-03-20 DIAGNOSIS — I10 Essential (primary) hypertension: Secondary | ICD-10-CM | POA: Diagnosis not present

## 2018-03-20 DIAGNOSIS — M7989 Other specified soft tissue disorders: Secondary | ICD-10-CM

## 2018-03-20 MED ORDER — FUROSEMIDE 40 MG PO TABS: 40.0000 mg | ORAL_TABLET | Freq: Two times a day (BID) | ORAL | 11 refills | Status: DC

## 2018-03-20 NOTE — Patient Instructions (Signed)
Medication Instructions:  INCREASE- Furosemide(Lasix) 40 mg twice a day  If you need a refill on your cardiac medications before your next appointment, please call your pharmacy.  Labwork: None Ordered   Testing/Procedures: None Ordered  Follow-Up: Your physician wants you to follow-up in: 1 Week with [employer].      Thank you for choosing [facility] at [facility]!!

## 2018-03-24 DIAGNOSIS — H353211 Exudative age-related macular degeneration, right eye, with active choroidal neovascularization: Secondary | ICD-10-CM | POA: Diagnosis not present

## 2018-03-24 DIAGNOSIS — H43811 Vitreous degeneration, right eye: Secondary | ICD-10-CM | POA: Diagnosis not present

## 2018-03-25 ENCOUNTER — Encounter: Payer: Self-pay | Admitting: Cardiology

## 2018-03-25 ENCOUNTER — Ambulatory Visit: Payer: PPO | Admitting: Cardiology

## 2018-03-25 VITALS — BP 131/74 | HR 67 | Ht <= 58 in | Wt 117.0 lb

## 2018-03-25 DIAGNOSIS — I5022 Chronic systolic (congestive) heart failure: Secondary | ICD-10-CM

## 2018-03-25 DIAGNOSIS — I110 Hypertensive heart disease with heart failure: Secondary | ICD-10-CM | POA: Diagnosis not present

## 2018-03-25 DIAGNOSIS — I5043 Acute on chronic combined systolic (congestive) and diastolic (congestive) heart failure: Secondary | ICD-10-CM | POA: Diagnosis not present

## 2018-03-25 DIAGNOSIS — I10 Essential (primary) hypertension: Secondary | ICD-10-CM

## 2018-03-25 DIAGNOSIS — N183 Chronic kidney disease, stage 3 unspecified: Secondary | ICD-10-CM

## 2018-03-25 DIAGNOSIS — I420 Dilated cardiomyopathy: Secondary | ICD-10-CM

## 2018-03-25 NOTE — Patient Instructions (Signed)
Medication Instructions:  INCREASE- Furosemide(Lasix) 40 mg twice a day  If you need a refill on your cardiac medications before your next appointment, please call your pharmacy.  Labwork: None Ordered   Testing/Procedures: None Ordered   Follow-Up: Your physician wants you to follow-up on Tuesday July 9th at 11:00 am with Kerin Ransom.      Thank you for choosing [facility] at [facility]!!

## 2018-03-25 NOTE — Progress Notes (Signed)
03/25/2018 Katherine Phillips   1929/12/17  [id number]  Primary Physician [name], MD Primary Cardiologist: Dr Percival Spanish  HPI:  84 y/o AA female, lives in her own home, still drives, has two sons who help her around the house. She had seen Dr Percival Spanish in the past for a murmur. Prior echo May 2017 showed an EF of 50% with moderate LVH and AOV sclerosis. Echo in Ag 2018 showed her EF to be 35-40% with grade 3 DD. No ischemic work up as undertaken. Her Norvasc and potassium were stopped and she has been treated with spironolactone and Cozaar. She recently saw Dr Percival Spanish 03/20/18 and her Lasix was increased to BID secondary to SOB. She is in the office today for follow up.   Unfortunately she never increased the Lasix. I'm not sure why. Her weight is down 3 lbs. She says she feels a little better but she is still SOB with any exertion.    Current Outpatient Medications  Medication Sig Dispense Refill  . alendronate (FOSAMAX) 70 MG tablet Take 70 mg by mouth once a week.  11  . carvedilol (COREG) 6.25 MG tablet Take 6.25 mg by mouth 2 (two) times daily.  1  . furosemide (LASIX) 40 MG tablet Take 1 tablet (40 mg total) by mouth 2 (two) times daily. 60 tablet 11  . KLOR-CON M20 20 MEQ tablet TAKE 1 TABLET BY MOUTH EVERY DAY 30 tablet 6  . pravastatin (PRAVACHOL) 40 MG tablet Take 40 mg by mouth daily.  0  . spironolactone (ALDACTONE) 25 MG tablet TAKE 1/2 TABLET BY MOUTH DAILY 30 tablet 6  . losartan (COZAAR) 50 MG tablet Take 1 tablet (50 mg total) by mouth daily. 30 tablet 11   No current facility-administered medications for this visit.     No Known Allergies  Past Medical History:  Diagnosis Date  . CKD (chronic kidney disease)   . HTN (hypertension)   . Hyperlipidemia   . RA (rheumatoid arthritis) (HCC)     Social History   Socioeconomic History  . Marital status: Widowed    Spouse name: Not on file  . Number of children: 5  . Years of education: Not on file  . Highest  education level: Not on file  Occupational History  . Not on file  Social Needs  . Financial resource strain: Not on file  . Food insecurity:    Worry: Not on file    Inability: Not on file  . Transportation needs:    Medical: Not on file    Non-medical: Not on file  Tobacco Use  . Smoking status: Never Smoker  . Smokeless tobacco: Never Used  Substance and Sexual Activity  . Alcohol use: No    Alcohol/week: 0.0 oz  . Drug use: No  . Sexual activity: Not on file  Lifestyle  . Physical activity:    Days per week: Not on file    Minutes per session: Not on file  . Stress: Not on file  Relationships  . Social connections:    Talks on phone: Not on file    Gets together: Not on file    Attends religious service: Not on file    Active member of club or organization: Not on file    Attends meetings of clubs or organizations: Not on file    Relationship status: Not on file  . Intimate partner violence:    Fear of current or ex partner: Not on file  Emotionally abused: Not on file    Physically abused: Not on file    Forced sexual activity: Not on file  Other Topics Concern  . Not on file  Social History Narrative   Lives alone.       Family History  Problem Relation Age of Onset  . Hypertension Mother   . Cancer Sister        Unknown     Review of Systems: General: negative for chills, fever, night sweats or weight changes.  Cardiovascular: negative for chest pain, dyspnea on exertion, edema, orthopnea, palpitations, paroxysmal nocturnal dyspnea or shortness of breath Dermatological: negative for rash Respiratory: negative for cough or wheezing Urologic: negative for hematuria Abdominal: negative for nausea, vomiting, diarrhea, bright red blood per rectum, melena, or hematemesis Neurologic: negative for visual changes, syncope, or dizziness All other systems reviewed and are otherwise negative except as noted above.    Blood pressure 131/74, pulse 67, height  4\' 10"  (1.473 m), weight 117 lb (53.1 kg).  General appearance: alert, cooperative and no distress Lungs: clear to auscultation bilaterally Heart: regular rate and rhythm Extremities: trace edema Skin: Skin color, texture, turgor normal. No rashes or lesions Neurologic: Grossly normal   ASSESSMENT AND PLAN:   Acute combined systolic and diastolic CHF Pt here for follow up but she never increased her Lasix  Cardiomyopathy Etiology not determined- presumably NICM. 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 Check BMP and BNP next week.   Kerin Ransom PA-C 03/25/2018 3:03 PM

## 2018-04-01 ENCOUNTER — Encounter: Payer: Self-pay | Admitting: Cardiology

## 2018-04-01 ENCOUNTER — Ambulatory Visit: Payer: PPO | Admitting: Cardiology

## 2018-04-01 ENCOUNTER — Telehealth: Payer: Self-pay | Admitting: Cardiology

## 2018-04-01 VITALS — BP 154/78 | HR 70 | Wt 116.4 lb

## 2018-04-01 DIAGNOSIS — R0609 Other forms of dyspnea: Secondary | ICD-10-CM

## 2018-04-01 DIAGNOSIS — N183 Chronic kidney disease, stage 3 unspecified: Secondary | ICD-10-CM

## 2018-04-01 DIAGNOSIS — I10 Essential (primary) hypertension: Secondary | ICD-10-CM

## 2018-04-01 DIAGNOSIS — I110 Hypertensive heart disease with heart failure: Secondary | ICD-10-CM

## 2018-04-01 DIAGNOSIS — Z79899 Other long term (current) drug therapy: Secondary | ICD-10-CM | POA: Diagnosis not present

## 2018-04-01 DIAGNOSIS — I5022 Chronic systolic (congestive) heart failure: Secondary | ICD-10-CM

## 2018-04-01 DIAGNOSIS — I5043 Acute on chronic combined systolic (congestive) and diastolic (congestive) heart failure: Secondary | ICD-10-CM | POA: Diagnosis not present

## 2018-04-01 DIAGNOSIS — I429 Cardiomyopathy, unspecified: Secondary | ICD-10-CM

## 2018-04-01 NOTE — Telephone Encounter (Signed)
Returned call to pt. Unable to leave message due to phone just ringing.

## 2018-04-01 NOTE — Telephone Encounter (Signed)
Returned call to patient who saw Lisman PA today. She is wondering was dyspnea on exertion means. Explained this is shortness of breath with exerting oneself, which she states she has. She asked how to prevent. Advised to take all meds as prescribed, monitor weights.

## 2018-04-01 NOTE — Telephone Encounter (Signed)
New Message:      Pt is calling and wanting to get clarification on her diagnosis today that is on her AVS. Pt saw Rosalyn Gess today

## 2018-04-01 NOTE — Patient Instructions (Signed)
Medication Instructions: Your physician recommends that you continue on your current medications as directed. Please refer to the Current Medication list given to you today.   abwork: Your physician recommends that you return for lab work: BMET, CBC, BNP   Follow-Up: Your physician recommends that you schedule a follow-up appointment in 3 months with Dr. Percival Spanish.

## 2018-04-01 NOTE — Progress Notes (Signed)
04/01/2018 Katherine Phillips   [birth year]  [id number]  Primary Physician [name], MD Primary Cardiologist: dr hochrein  HPI:  Delightful 84 y/o AA female, lives in her own home, still drives, has two sons who help her around the house. She had seen Dr Percival Spanish in the past for a murmur. Prior echo May 2017 showed an EF of 50% with moderate LVH and AOV sclerosis. Echo in Ag 2018 showed her EF to be 35-40% with grade 3 DD. No ischemic work up as undertaken. Her Norvasc and potassium were stopped and she has been treated with spironolactone and Cozaar. She recently saw Dr Percival Spanish 03/20/18 and her Lasix was increased to BID secondary to SOB. She was seen in the office 03/25/18 for follow up. Unfortunately she never increased the Lasix. I'm not sure why. Her weight was down 3 lbs. I instructed her to increase her Lasix to 40 mg BID and return today for follow up. She confirms she has been taking her Lasix as prescribed-40 mg BID. Her weight is down to 116- it was 120 lbs when dr Percival Spanish saw her on 03/20/18. She denies orthopnea. She says she has less DOE, but still says she has some. She denies chest pain or tightness. She denies palpitations.    Current Outpatient Medications  Medication Sig Dispense Refill  . alendronate (FOSAMAX) 70 MG tablet Take 70 mg by mouth once a week.  11  . carvedilol (COREG) 6.25 MG tablet Take 6.25 mg by mouth 2 (two) times daily.  1  . furosemide (LASIX) 40 MG tablet Take 1 tablet (40 mg total) by mouth 2 (two) times daily. 60 tablet 11  . KLOR-CON M20 20 MEQ tablet TAKE 1 TABLET BY MOUTH EVERY DAY 30 tablet 6  . pravastatin (PRAVACHOL) 40 MG tablet Take 40 mg by mouth daily.  0  . spironolactone (ALDACTONE) 25 MG tablet TAKE 1/2 TABLET BY MOUTH DAILY 30 tablet 6  . losartan (COZAAR) 50 MG tablet Take 1 tablet (50 mg total) by mouth daily. 30 tablet 11   No current facility-administered medications for this visit.     No Known Allergies  Past Medical History:    Diagnosis Date  . CKD (chronic kidney disease)   . HTN (hypertension)   . Hyperlipidemia   . RA (rheumatoid arthritis) (HCC)     Social History   Socioeconomic History  . Marital status: Widowed    Spouse name: Not on file  . Number of children: 5  . Years of education: Not on file  . Highest education level: Not on file  Occupational History  . Not on file  Social Needs  . Financial resource strain: Not on file  . Food insecurity:    Worry: Not on file    Inability: Not on file  . Transportation needs:    Medical: Not on file    Non-medical: Not on file  Tobacco Use  . Smoking status: Never Smoker  . Smokeless tobacco: Never Used  Substance and Sexual Activity  . Alcohol use: No    Alcohol/week: 0.0 oz  . Drug use: No  . Sexual activity: Not on file  Lifestyle  . Physical activity:    Days per week: Not on file    Minutes per session: Not on file  . Stress: Not on file  Relationships  . Social connections:    Talks on phone: Not on file    Gets together: Not on file    Attends  religious service: Not on file    Active member of club or organization: Not on file    Attends meetings of clubs or organizations: Not on file    Relationship status: Not on file  . Intimate partner violence:    Fear of current or ex partner: Not on file    Emotionally abused: Not on file    Physically abused: Not on file    Forced sexual activity: Not on file  Other Topics Concern  . Not on file  Social History Narrative   Lives alone.       Family History  Problem Relation Age of Onset  . Hypertension Mother   . Cancer Sister        Unknown     Review of Systems: General: negative for chills, fever, night sweats or weight changes.  Cardiovascular: negative for chest pain, dyspnea on exertion, edema, orthopnea, palpitations, paroxysmal nocturnal dyspnea or shortness of breath Dermatological: negative for rash Respiratory: negative for cough or wheezing Urologic:  negative for hematuria Abdominal: negative for nausea, vomiting, diarrhea, bright red blood per rectum, melena, or hematemesis Neurologic: negative for visual changes, syncope, or dizziness All other systems reviewed and are otherwise negative except as noted above.    Blood pressure (!) 154/78, pulse 70, weight 116 lb 6.4 oz (52.8 kg).  General appearance: alert, cooperative and no distress Neck: no carotid bruit and no JVD Lungs: clear to auscultation bilaterally Heart: regular rate and rhythm and 1/6 short systolic murmur AOV Extremities: no edema Skin: Skin color, texture, turgor normal. No rashes or lesions Neurologic: Grossly normal   ASSESSMENT AND PLAN:   Acute combined systolic and diastolic CHF Pt here for follow up after increasing her Lasix  Cardiomyopathy Etiology not determined- presumably NICM. 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 Adjust Lasix based on labs- she does not appear to have CHF on exam. If she continues to have DOE I would try adding a nitrate. F/U Dr Percival Spanish in 3 months.   Kerin Ransom PA-C 04/01/2018 11:23 AM

## 2018-04-02 LAB — BASIC METABOLIC PANEL
BUN/Creatinine Ratio: 18 (ref 12–28)
BUN: 33 mg/dL — ABNORMAL HIGH (ref 8–27)
CO2: 25 mmol/L (ref 20–29)
Calcium: 9.5 mg/dL (ref 8.7–10.3)
Chloride: 100 mmol/L (ref 96–106)
Creatinine, Ser: 1.87 mg/dL — ABNORMAL HIGH (ref 0.57–1.00)
GFR calc Af Amer: 28 mL/min/{1.73_m2} — ABNORMAL LOW (ref >59)
GFR calc non Af Amer: 24 mL/min/{1.73_m2} — ABNORMAL LOW (ref >59)
Glucose: 82 mg/dL (ref 65–99)
Potassium: 4.9 mmol/L (ref 3.5–5.2)
Sodium: 141 mmol/L (ref 134–144)

## 2018-04-02 LAB — CBC
Hematocrit: 40 % (ref 34.0–46.6)
Hemoglobin: 13.3 g/dL (ref 11.1–15.9)
MCH: 31.3 pg (ref 26.6–33.0)
MCHC: 33.3 g/dL (ref 31.5–35.7)
MCV: 94 fL (ref 79–97)
Platelets: 214 10*3/uL (ref 150–450)
RBC: 4.25 x10E6/uL (ref 3.77–5.28)
RDW: 14.8 % (ref 12.3–15.4)
WBC: 3 10*3/uL — ABNORMAL LOW (ref 3.4–10.8)

## 2018-04-02 LAB — BRAIN NATRIURETIC PEPTIDE: BNP: 384.1 pg/mL — ABNORMAL HIGH (ref 0.0–100.0)

## 2018-04-07 ENCOUNTER — Other Ambulatory Visit: Payer: Self-pay

## 2018-04-07 MED ORDER — FUROSEMIDE 40 MG PO TABS: ORAL_TABLET | ORAL | 11 refills | Status: DC

## 2018-04-08 ENCOUNTER — Ambulatory Visit: Payer: PPO | Admitting: Cardiology

## 2018-05-05 ENCOUNTER — Telehealth: Payer: Self-pay

## 2018-05-05 NOTE — Telephone Encounter (Signed)
Received labs from [place] , sent to [facility].

## 2018-07-01 NOTE — Progress Notes (Signed)
Cardiology Office Note   Date:  07/03/2018   ID:  YOUNG BRIM, DOB [birth year], MRN [id number]  PCP:  [name], MD  Cardiologist:   Minus Breeding, MD   Chief Complaint  Patient presents with  . Cardiomyopathy      History of Present Illness: Katherine Phillips is a 84 y.o. female who presents for evaluation of a murmur. Prior echo May 2017 showed an EF of 50% with moderate LVH and AOV sclerosis.Echo in Aug 2018 showed her EF to be 35-40% with grade 3 DD. No ischemic work up as undertaken.Her Norvasc and potassium were stopped and she has been treated with spironolactone and Cozaar.  She has continued to have SOB and has required adjustments to her diuretic.    The patient denies any cardiovascular symptoms.  She lives independently and she says she does her own chores.  She reports that she does not take her medicines every day.  Her daughter was in the waiting room and brought her back and she reports that she has significant dementia which is clear in that the patient asked the same questions over and over again.  Her daughter says she takes the medicines if she has swelling.  They do not check every day that she is taking and she would refuse on some days even if they insisted.  Despite this she is not having any chest pressure, neck or arm discomfort.  She is not having any palpitations, presyncope or syncope.  She has no significant shortness of breath, PND or orthopnea.  She complains about being tired.   Past Medical History:  Diagnosis Date  . CKD (chronic kidney disease)   . HTN (hypertension)   . Hyperlipidemia   . RA (rheumatoid arthritis) ([place])     Past Surgical History:  Procedure Laterality Date  . ABDOMINAL HYSTERECTOMY    . CATARACT EXTRACTION    . HIATAL HERNIA REPAIR    . TUBAL LIGATION       Current Outpatient Medications  Medication Sig Dispense Refill  . alendronate (FOSAMAX) 70 MG tablet Take 70 mg by mouth once a week.  11  . carvedilol  (COREG) 6.25 MG tablet Take 6.25 mg by mouth 2 (two) times daily.  1  . furosemide (LASIX) 40 MG tablet Take 1 tablet po qd; unless you gain 3lbs in a day then increase to 1tab po bid (Patient not taking: Reported on 07/03/2018) 30 tablet 11  . KLOR-CON M20 20 MEQ tablet TAKE 1 TABLET BY MOUTH EVERY DAY (Patient not taking: Reported on 07/03/2018) 30 tablet 6  . losartan (COZAAR) 50 MG tablet Take 1 tablet (50 mg total) by mouth daily. (Patient not taking: Reported on 07/03/2018) 30 tablet 11  . pravastatin (PRAVACHOL) 40 MG tablet Take 40 mg by mouth daily.  0  . spironolactone (ALDACTONE) 25 MG tablet TAKE 1/2 TABLET BY MOUTH DAILY (Patient not taking: Reported on 07/03/2018) 30 tablet 6   No current facility-administered medications for this visit.     Allergies:   Patient has no known allergies.    ROS:  Please see the history of present illness.   Otherwise, review of systems are positive for none.   All other systems are reviewed and negative.    PHYSICAL EXAM: VS:  BP (!) 180/85 (BP Location: Right Arm, Patient Position: Sitting, Cuff Size: Normal)   Pulse 66   Wt 114 lb 9.6 oz (52 kg)   SpO2 97%   BMI 23.95  kg/m  , BMI Body mass index is 23.95 kg/m.  GENERAL:  Well appearing NECK:  No jugular venous distention, waveform within normal limits, carotid upstroke brisk and symmetric, no bruits, no thyromegaly LUNGS:  Clear to auscultation bilaterally CHEST:  Unremarkable HEART:  PMI not displaced or sustained,S1 and S2 within normal limits, no S3, no S4, no clicks, no rubs, 2 out of 6 apical systolic murmur radiating slightly at the aortic outflow tract, no diastolic murmurs ABD:  Flat, positive bowel sounds normal in frequency in pitch, no bruits, no rebound, no guarding, no midline pulsatile mass, no hepatomegaly, no splenomegaly EXT:  2 plus pulses throughout, mild left leg edema, no cyanosis no clubbing, missing right index finger.     EKG:  EKG is not  ordered  today.   Recent Labs: 04/01/2018: BNP 384.1; BUN 33; Creatinine, Ser 1.87; Hemoglobin 13.3; Platelets 214; Potassium 4.9; Sodium 141    Lipid Panel No results found for: CHOL, TRIG, HDL, CHOLHDL, VLDL, LDLCALC, LDLDIRECT    Wt Readings from Last 3 Encounters:  07/03/18 114 lb 9.6 oz (52 kg)  04/01/18 116 lb 6.4 oz (52.8 kg)  03/25/18 117 lb (53.1 kg)      Other studies Reviewed: Additional studies/ records that were reviewed today include: labs Review of the above records demonstrates:  See elsewhere   ASSESSMENT AND PLAN:  CARDIOMYOPATHY:    It turns out the patient is not taking her medications.  She did not take them today by her report.  I cannot adjust her medications further but instead had a conversation with her daughter about the need to try to make sure she is taking her meds as listed above every day.  Otherwise no change in therapy.  RENAL CYSTS:   This is followed by her [name], MD   HTN:   This is being managed in the context of treating his CHF.  Her pre ssures are elevated today but I think this is related to the fact she is not consistently taking her medicines.  The plan is as above.   CKD:   Her creatinine was elevated in July.  She has canceled follow-up appointments with her nephrologist.  I talked to her daughter about this today.  She needs to have follow-up with nephrology as suggested and be compliant with the meds as listed.  Current medicines are reviewed at length with the patient today.  The patient does not have concerns regarding medicines.  The following changes have been made:  None  Labs/ tests ordered today include:  None  No orders of the defined types were placed in this encounter.    Disposition:   FU with APP in six months    Signed, Minus Breeding, MD  07/03/2018 12:32 PM    [facility]

## 2018-07-03 ENCOUNTER — Encounter: Payer: Self-pay | Admitting: Cardiology

## 2018-07-03 ENCOUNTER — Ambulatory Visit: Payer: PPO | Admitting: Cardiology

## 2018-07-03 VITALS — BP 180/85 | HR 66 | Wt 114.6 lb

## 2018-07-03 DIAGNOSIS — M7989 Other specified soft tissue disorders: Secondary | ICD-10-CM | POA: Diagnosis not present

## 2018-07-03 DIAGNOSIS — I429 Cardiomyopathy, unspecified: Secondary | ICD-10-CM | POA: Diagnosis not present

## 2018-07-03 DIAGNOSIS — I10 Essential (primary) hypertension: Secondary | ICD-10-CM | POA: Diagnosis not present

## 2018-07-03 DIAGNOSIS — N183 Chronic kidney disease, stage 3 unspecified: Secondary | ICD-10-CM

## 2018-07-03 IMAGING — CT CT ABD-PELV W/O CM
3 of 4 series · 12 of 36 positions shown, 18 images · IV contrast (READICAT/WATER &)
Comparison: None.

CLINICAL DATA: Followup renal lesions.  Bilateral feet swelling.

EXAM:
CT ABDOMEN AND PELVIS WITHOUT CONTRAST
TECHNIQUE: Multidetector CT imaging of the abdomen and pelvis was performed
following the standard protocol without IV contrast.

[Series 3: abd/pelvis w/o · axial · non-contrast · 0.65mm/px · z∈[-312,-42]mm · 8 of 70 slices shown, 13 images]
[im 8/70  soft-tissue]
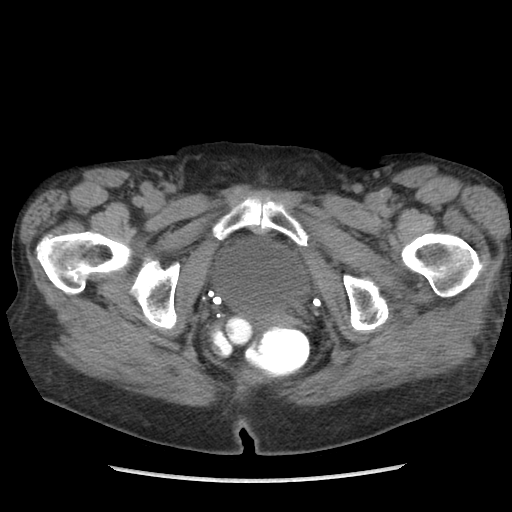
[im 8/70  bone]
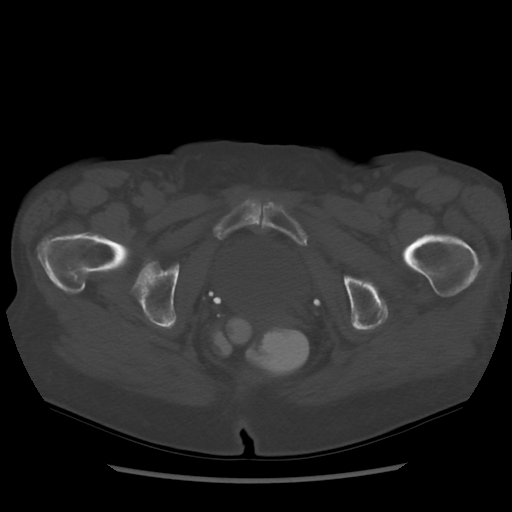
[im 16/70  soft-tissue]
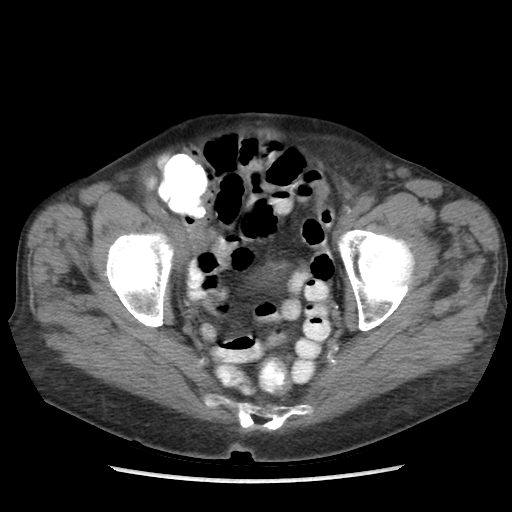
[im 24/70  soft-tissue]
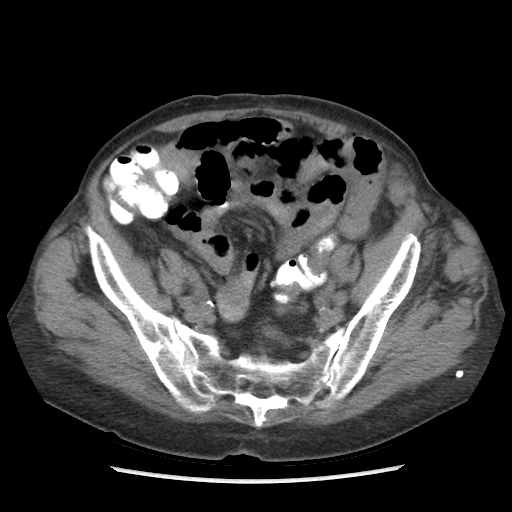
[im 31/70  soft-tissue]
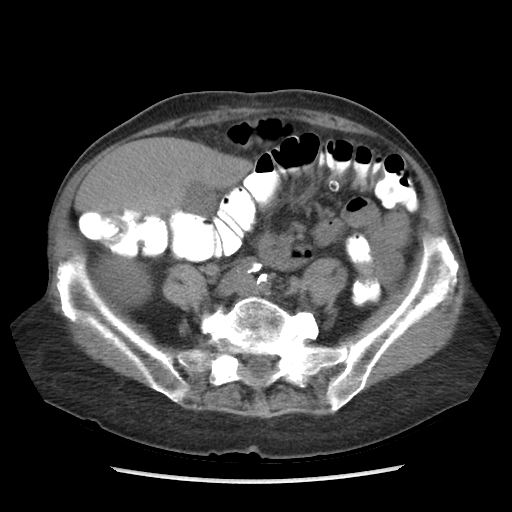
[im 39/70  soft-tissue]
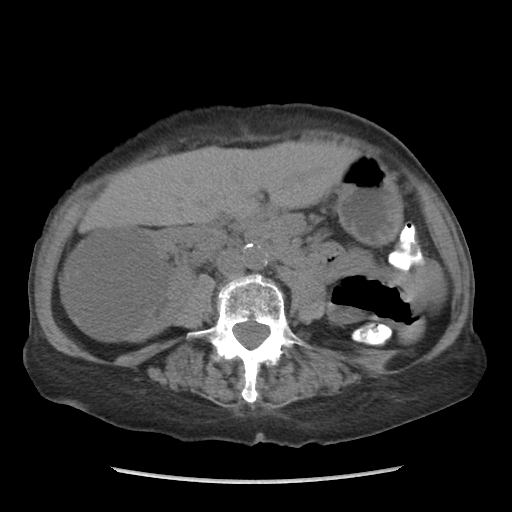
[im 39/70  lung]
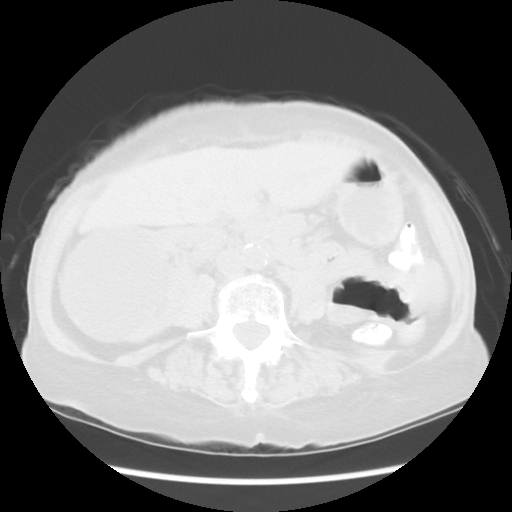
[im 47/70  soft-tissue]
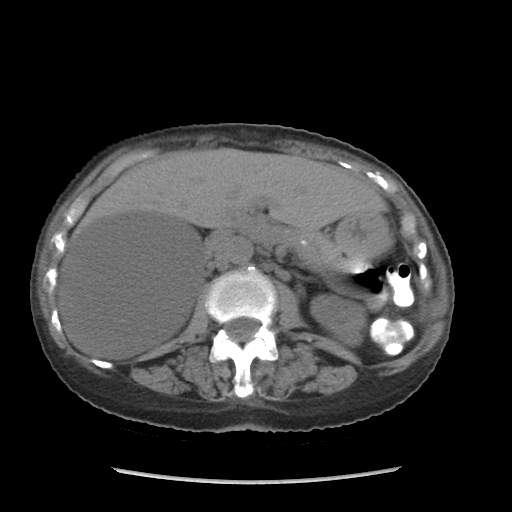
[im 47/70  lung]
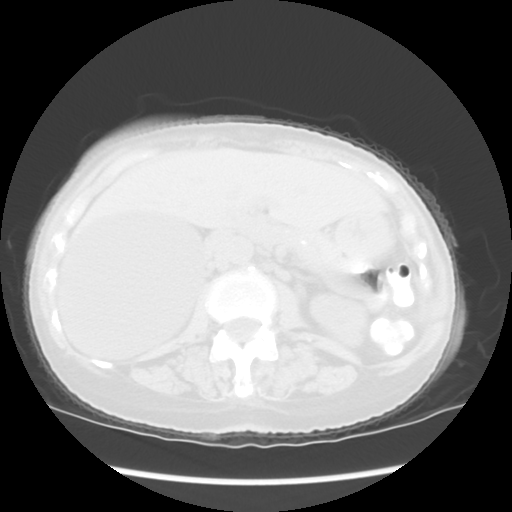
[im 54/70  soft-tissue]
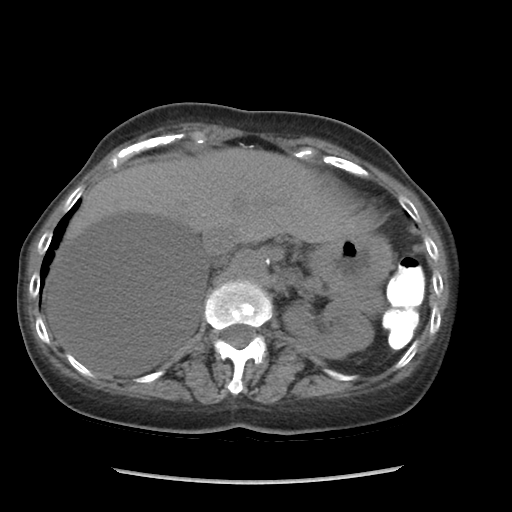
[im 54/70  lung]
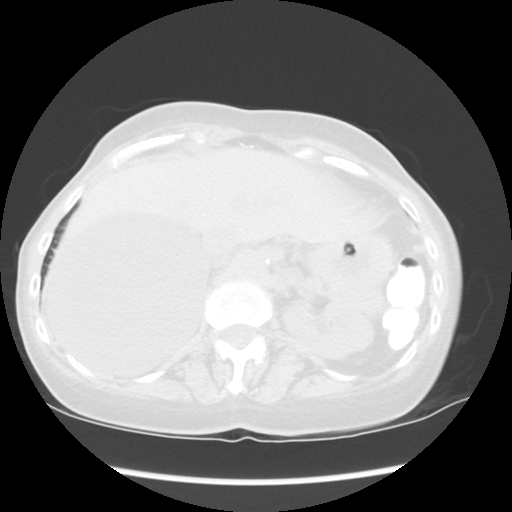
[im 62/70  soft-tissue]
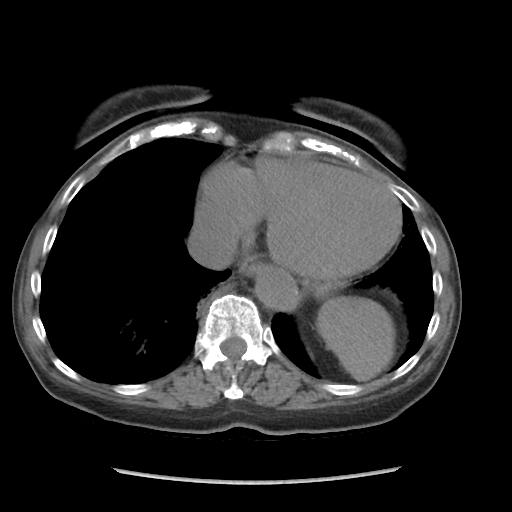
[im 62/70  lung]
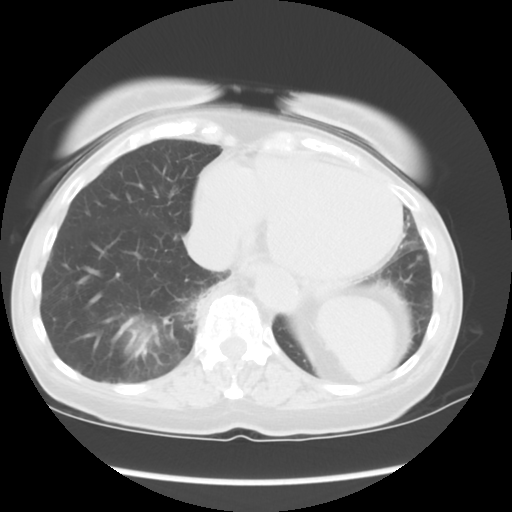

[Series 601: coronal body · coronal · 0.72mm/px · 1 of 109 slices shown, 2 images]
[im 37/109  soft-tissue]
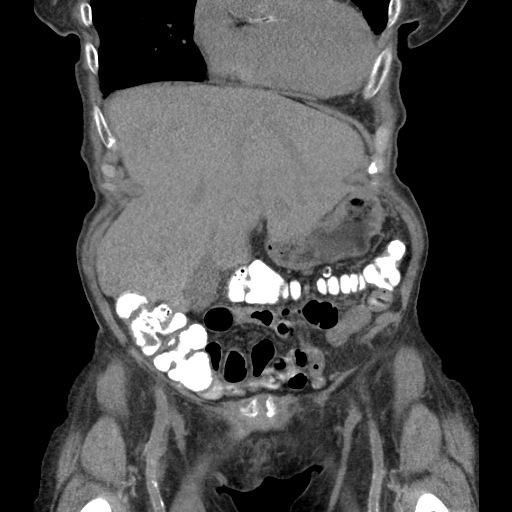
[im 37/109  bone]
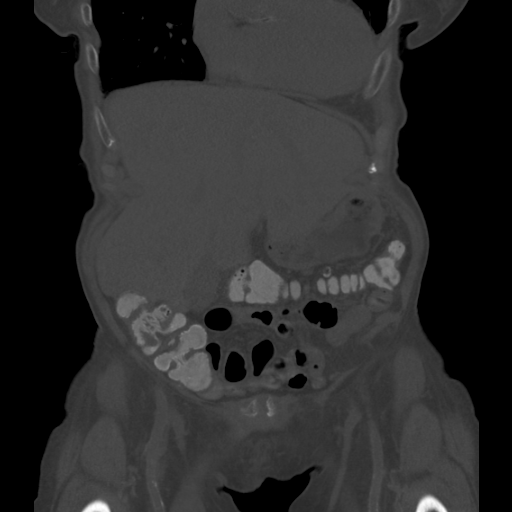

[Series 602: sagittal body · sagittal · 0.72mm/px · 3 of 134 slices shown]
[im 15/134  soft-tissue]
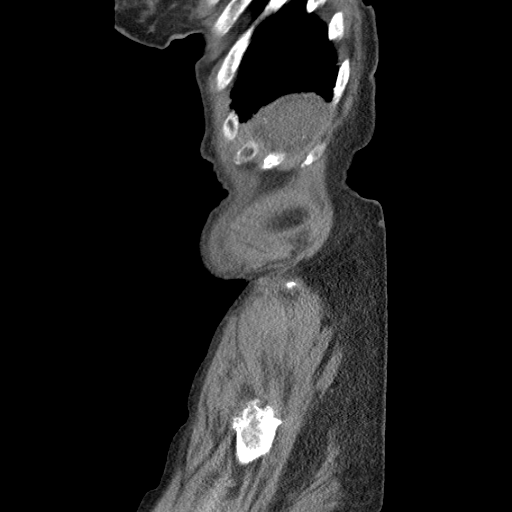
[im 30/134  soft-tissue]
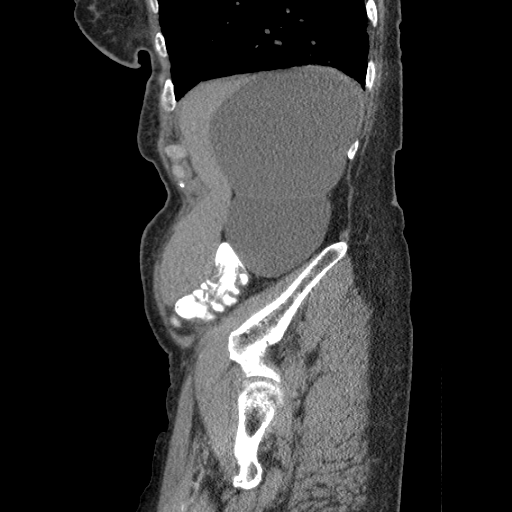
[im 45/134  soft-tissue]
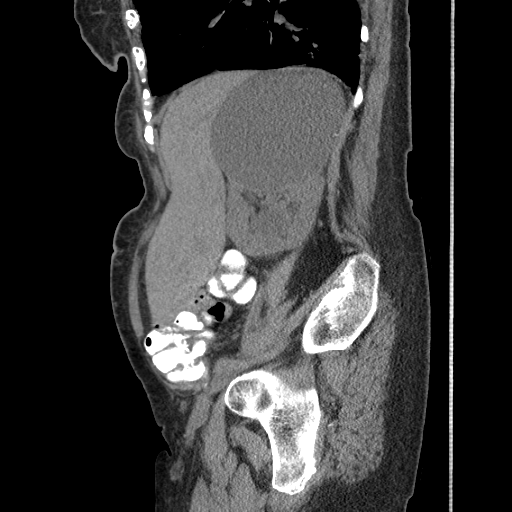

[12 of 36 positions shown; findings below may reference images not displayed]

FINDINGS: Lower chest: No significant pulmonary findings or worrisome
pulmonary lesions. The heart is mildly enlarged. Moderate
atherosclerotic calcifications involving the aorta and coronary
arteries. No pericardial effusion. No pleural effusion.

Hepatobiliary: No focal hepatic lesions or intrahepatic biliary
dilatation. The gallbladder is normal. No common bile duct
dilatation.

Pancreas: No mass, inflammation or ductal dilatation.

Spleen: Small in size but no lesions.

Adrenals/Urinary Tract: Left adrenal gland adenoma. The right
adrenal gland is normal. There are 2 large cysts associated with the
right kidney. The upper pole cyst measures approximately 10 x 9 cm
and a lower pole cyst measures 6 x 5 cm. Thin rim-like
calcifications are noted. No worrisome renal lesions. There is also
a simple midpole left renal cyst. No hydronephrosis, renal or
obstructing ureteral calculi. No bladder calculi.

Stomach/Bowel: The stomach, duodenum, small bowel and colon are
grossly normal. No inflammatory changes, mass lesions or obstructive
findings.

Vascular/Lymphatic: No mesenteric or retroperitoneal mass or
adenopathy. Advanced atherosclerotic calcifications and moderate
tortuosity of the aorta and iliac arteries.

Reproductive: The uterus and ovaries are surgically absent.

Other: Small cystocele is noted. No pelvic mass or adenopathy. No
free pelvic fluid collections. Bilateral inguinal hernias containing
fat.

Musculoskeletal: Moderate degenerative changes involving the spine
and hips. No acute bony findings or worrisome bone lesions.
Benign-appearing sclerotic lesion involving the right iliac bone.
IMPRESSION: 1. No acute abdominal findings, mass lesions or adenopathy.
2. Large Bosniak 2 right renal cysts.

## 2018-07-03 NOTE — Patient Instructions (Signed)
Medication Instructions:  NO CHANGE If you need a refill on your cardiac medications before your next appointment, please call your pharmacy.   Lab work: If you have labs (blood work) drawn today and your tests are completely normal, you will receive your results only by: Marland Kitchen MyChart Message (if you have MyChart) OR . A paper copy in the mail If you have any lab test that is abnormal or we need to change your treatment, we will call you to review the results.  Follow-Up: At [facility], you and your health needs are our priority.  As part of our continuing mission to provide you with exceptional heart care, we have created designated Provider Care Teams.  These Care Teams include your primary Cardiologist (physician) and Advanced Practice Providers (APPs -  Physician Assistants and Nurse Practitioners) who all work together to provide you with the care you need, when you need it. You will need a follow up appointment in 6 months.  Please call our office 2 months in advance to schedule this appointment.  You may see one of the following Advanced Practice Providers on your designated Care Team:   Rosaria Ferries, PA-C . Jory Sims, DNP, ANP

## 2018-07-21 DIAGNOSIS — H3562 Retinal hemorrhage, left eye: Secondary | ICD-10-CM | POA: Diagnosis not present

## 2018-07-21 DIAGNOSIS — H353222 Exudative age-related macular degeneration, left eye, with inactive choroidal neovascularization: Secondary | ICD-10-CM | POA: Diagnosis not present

## 2018-07-21 DIAGNOSIS — H35721 Serous detachment of retinal pigment epithelium, right eye: Secondary | ICD-10-CM | POA: Diagnosis not present

## 2018-07-21 DIAGNOSIS — H353212 Exudative age-related macular degeneration, right eye, with inactive choroidal neovascularization: Secondary | ICD-10-CM | POA: Diagnosis not present

## 2018-09-02 ENCOUNTER — Other Ambulatory Visit: Payer: Self-pay

## 2018-09-02 NOTE — Patient Outreach (Signed)
Katherine Phillips) Care Management  09/02/2018  Katherine Phillips November 15, 1929 [id number]   TELEPHONE SCREENING Referral date: 09/01/18  Referral source: [employer]/Insurance Referral reason: Dementia and medication adherence Insurance: [employer]  Telephone call to patient to screen for care management needs after [employer] wellness visit.  Member oriented x 3 but very forgetful and repeats herself during assessment.  States she does not take any medications and does not know what health issues she has.  States she is not sure if she has high blood pressure.  Member did not recall going to see the cardiologist in October.  States her daughters take her to her doctors appts but states she still drives herself to shop.  She can not remember when she last saw Dr.Smith or if she has had a flu shot this year. States she does not have a living will.  Denies any falls but reports being unsteady.   Per chart review [facility] has dx of systolic heart failure, cardiomyopathy HTN, chronic kidney disease stage 3, and memory changes.   Discussed [place] Management services and is agreeable to referral to complex case management, pharmacy and social worker.  PLAN: Plan to refer to complex case management to assess home safety, cognitive status,  medication non-adherence and education on heart failure and HTN Plan to refer to pharmacy for medication non-adherence Plan to refer to Social Work for care giver resources and Sartell RN, [facility], [place] Management Coordinator [facility] Care Management [phone]

## 2018-09-03 ENCOUNTER — Other Ambulatory Visit: Payer: Self-pay | Admitting: *Deleted

## 2018-09-03 NOTE — Patient Outreach (Signed)
Telephone outreach for in home assessment referral for safety evaluation and self management efficiency. I called today and Mrs. Katherine Phillips answered the phone but she could not hear me well. Her daughter, [name], was there and she allowed me to speak with her. I advised Mrs. [name] that I had received a referral to do an in home evaluation for safety concerns and medical self management efficiency from [facility]. Diane, said, mother will see Dr. Tamala Julian next week and she takes care of all of that. I explained that our service is free and I work with the doctor and family members to ensure pt safety and adequate support in our elder population. Diane was pleasant but reluctant to agree to have a visit with herself and her mother. She says her mother lives in her own home. Diane visits daily and other family members come and go. She says her mother takes her own meds but others remind her to do so or ask if she has taken them. I told her that her mother told my co-worker that she didn't take meds and she doesn't have any medical problems. Diane assured me she does take meds but no one really supervises this process. She denies her mother has had any recent falls.   I advised I will send her a letter which describes our services and will send Dr. Tamala Julian a message letting her know that Diane doesn't feel like our services are warranted.   I am available to make the evaluation if permission is granted at anytime in the future.  Eulah Pont. Myrtie Neither, MSN, [facility] Nurse Practitioner [facility] Care Management [phone]

## 2018-09-04 ENCOUNTER — Other Ambulatory Visit: Payer: Self-pay | Admitting: Pharmacist

## 2018-09-04 NOTE — Patient Outreach (Signed)
[facility]) Care Management  [place]  09/04/2018  Katherine Phillips 1930-05-11 [id number]   Reason for referral: medication management  Unsuccessful telephone call attempt #1 to patient.   HIPAA compliant voicemail left requesting a return call   Noted patient's daughter declined [facility] services yesterday for [facility] NP to make home visit.   Plan:  Noted that another [facility] discipline has recently mailed patient an unsuccessful outreach letter, I will make another outreach attempt to patient within 3-4 business days

## 2018-09-08 ENCOUNTER — Other Ambulatory Visit: Payer: Self-pay

## 2018-09-08 NOTE — Patient Outreach (Signed)
[place] [facility]) Care Management  09/08/2018  Katherine Phillips 28-Apr-1930 [id number]  BSW attempted to contact the patient on today's date to conduct a community resource consult and assist with the completion of advance directives. Unfortunately, today's call was unsuccessful. BSW left the patient a HIPAA compliant voice message requesting a return call.   Plan: BSW will mail the patient an unsuccessful outreach letter. BSW will attempt the patient again within the next four business days.  Daneen Schick, BSW, CDP [facility] [phone]

## 2018-09-09 ENCOUNTER — Other Ambulatory Visit: Payer: Self-pay | Admitting: Pharmacist

## 2018-09-09 NOTE — Patient Outreach (Signed)
[facility] ([facility]) Care Management [facility]  09/09/2018  Khiley B Hernandes 08/02/1930 [id number]  Reason for referral: medication management  2nd call attempt to Ms. Arietta Liuzzi regarding medications.  Patient denied need to discuss medications with me and referred me to her daughter, Diana.    Successful outreach call to Diana.  HIPAA identifiers verified.  Daughter reports that patient is often confused and may state she isn't taking her medications when she actually is.  Daughter reports that patient has 5 children and at least one child visits patient each day and helps her take her medications.  She denies needing a medication review or any medication related questions.   [facility] case is being closed due to the following reasons:  Goals have been met. Patient has been provided [facility] contact information if assistance needed in the future.   Thank you for allowing [facility] pharmacy to be involved in this patient's care.     , PharmD, BCPS Clinical Pharmacist [facility] [phone]        

## 2018-09-10 ENCOUNTER — Ambulatory Visit: Payer: Self-pay | Admitting: Pharmacist

## 2018-09-11 ENCOUNTER — Other Ambulatory Visit: Payer: Self-pay

## 2018-09-11 NOTE — Patient Outreach (Signed)
[place] [facility]) Care Management  09/11/2018  Katherine Phillips 1929/10/15 [id number]  Successful outreach to the patient on today's date, HIPAA identifiers confirmed. BSW introduced self to the patient and the reason for today's call, indicating the patient had been referred to this BSW for assistance with advance directives. The patient denies knowledge of an advance directive. BSW explained to the patient what an advance directive is. The patient stated "I got five children and one of them will do it. Thank you, bye". The patient then disconnected the call.  BSW to perform a case closure as the patient has declined all [place] [facility] disciplines.  Daneen Schick, BSW, CDP [facility] [phone]

## 2018-09-26 DIAGNOSIS — R609 Edema, unspecified: Secondary | ICD-10-CM | POA: Diagnosis not present

## 2018-09-26 DIAGNOSIS — R413 Other amnesia: Secondary | ICD-10-CM | POA: Diagnosis not present

## 2018-09-26 DIAGNOSIS — N183 Chronic kidney disease, stage 3 (moderate): Secondary | ICD-10-CM | POA: Diagnosis not present

## 2018-09-26 DIAGNOSIS — I10 Essential (primary) hypertension: Secondary | ICD-10-CM | POA: Diagnosis not present

## 2018-09-26 DIAGNOSIS — I5020 Unspecified systolic (congestive) heart failure: Secondary | ICD-10-CM | POA: Diagnosis not present

## 2018-09-26 DIAGNOSIS — E538 Deficiency of other specified B group vitamins: Secondary | ICD-10-CM | POA: Diagnosis not present

## 2018-10-02 ENCOUNTER — Encounter: Payer: Self-pay | Admitting: Cardiology

## 2018-10-02 ENCOUNTER — Ambulatory Visit (INDEPENDENT_AMBULATORY_CARE_PROVIDER_SITE_OTHER): Payer: PPO | Admitting: Cardiology

## 2018-10-02 VITALS — BP 124/64 | HR 66 | Ht <= 58 in | Wt 112.0 lb

## 2018-10-02 DIAGNOSIS — I10 Essential (primary) hypertension: Secondary | ICD-10-CM

## 2018-10-02 DIAGNOSIS — N183 Chronic kidney disease, stage 3 unspecified: Secondary | ICD-10-CM

## 2018-10-02 DIAGNOSIS — I429 Cardiomyopathy, unspecified: Secondary | ICD-10-CM | POA: Diagnosis not present

## 2018-10-02 DIAGNOSIS — F0390 Unspecified dementia without behavioral disturbance: Secondary | ICD-10-CM | POA: Insufficient documentation

## 2018-10-02 DIAGNOSIS — I5022 Chronic systolic (congestive) heart failure: Secondary | ICD-10-CM | POA: Diagnosis not present

## 2018-10-02 DIAGNOSIS — I110 Hypertensive heart disease with heart failure: Secondary | ICD-10-CM

## 2018-10-02 NOTE — Patient Instructions (Signed)
Medication Instructions:  Your physician recommends that you continue on your current medications as directed. Please refer to the Current Medication list given to you today. If you need a refill on your cardiac medications before your next appointment, please call your pharmacy.   Lab work: None  If you have labs (blood work) drawn today and your tests are completely normal, you will receive your results only by: Marland Kitchen MyChart Message (if you have MyChart) OR . A paper copy in the mail If you have any lab test that is abnormal or we need to change your treatment, we will call you to review the results.  Testing/Procedures: None   Follow-Up: At [facility], you and your health needs are our priority.  As part of our continuing mission to provide you with exceptional heart care, we have created designated Provider Care Teams.  These Care Teams include your primary Cardiologist (physician) and Advanced Practice Providers (APPs -  Physician Assistants and Nurse Practitioners) who all work together to provide you with the care you need, when you need it. You will need a follow up appointment in 6 months.  Please call our office 2 months in advance to schedule this appointment.  You may see Dr Minus Breeding or one of the following Advanced Practice Providers on your designated Care Team:   Rosaria Ferries, PA-C . Jory Sims, DNP, ANP  Any Other Special Instructions Will Be Listed Below (If Applicable). We will contact your pcp and be in touch with you soon!

## 2018-10-02 NOTE — Progress Notes (Addendum)
10/02/2018 Katherine Phillips   10-22-1929  [id number]  Primary Physician [name], MD Primary Cardiologist: Dr Percival Spanish  HPI:  Pleasant 85y/o [place] female with dementia, lives in her own home,her daughter Katherine Phillips helps her . She has a history of HTN and CHF. Prior echo May 2017 showed an EF of 50% with moderate LVH and AOV sclerosis.Echo in Aug 2018 showed her EF to be 35-40% with grade 3 DD. No ischemic work up as undertaken.We have seen her off and on over the past year and made adjustments to her medications. At her LOV it became clear that the patient doesn't take her medications as prescribed.  Her daughter says she gives her mother medications now, though the patient says "I don't take that stuff".   The patient was recently seen by Dr Tamala Julian and medication adjustments were made. The patient has been weak lately according to [facility]. The daughter is not sure what was changed, I think her Coreg may have been decreased.  I don't have access to the lab work or office note but I get the sense the patient was weak and possibly dehydrated.  She appears stable today- no CHF on exam.    Current Outpatient Medications  Medication Sig Dispense Refill  . carvedilol (COREG) 6.25 MG tablet Take 6.25 mg by mouth 2 (two) times daily.  1  . furosemide (LASIX) 40 MG tablet Take 1 tablet po qd; unless you gain 3lbs in a day then increase to 1tab po bid 30 tablet 11  . losartan (COZAAR) 50 MG tablet Take 1 tablet (50 mg total) by mouth daily. 30 tablet 11  . pravastatin (PRAVACHOL) 40 MG tablet Take 40 mg by mouth daily.  0  . spironolactone (ALDACTONE) 25 MG tablet TAKE 1/2 TABLET BY MOUTH DAILY 30 tablet 6   No current facility-administered medications for this visit.     No Known Allergies  Past Medical History:  Diagnosis Date  . CKD (chronic kidney disease)   . HTN (hypertension)   . Hyperlipidemia   . RA (rheumatoid arthritis) (HCC)     Social History   Socioeconomic History  .  Marital status: Widowed    Spouse name: Not on file  . Number of children: 5  . Years of education: Not on file  . Highest education level: Not on file  Occupational History  . Not on file  Social Needs  . Financial resource strain: Not on file  . Food insecurity:    Worry: Not on file    Inability: Not on file  . Transportation needs:    Medical: Not on file    Non-medical: Not on file  Tobacco Use  . Smoking status: Never Smoker  . Smokeless tobacco: Never Used  Substance and Sexual Activity  . Alcohol use: No    Alcohol/week: 0.0 standard drinks  . Drug use: No  . Sexual activity: Not on file  Lifestyle  . Physical activity:    Days per week: Not on file    Minutes per session: Not on file  . Stress: Not on file  Relationships  . Social connections:    Talks on phone: Not on file    Gets together: Not on file    Attends religious service: Not on file    Active member of club or organization: Not on file    Attends meetings of clubs or organizations: Not on file    Relationship status: Not on file  . Intimate partner  violence:    Fear of current or ex partner: Not on file    Emotionally abused: Not on file    Physically abused: Not on file    Forced sexual activity: Not on file  Other Topics Concern  . Not on file  Social History Narrative   Lives alone.       Family History  Problem Relation Age of Onset  . Hypertension Mother   . Cancer Sister        Unknown     Review of Systems: General: negative for chills, fever, night sweats or weight changes.  Cardiovascular: negative for chest pain, dyspnea on exertion, edema, orthopnea, palpitations, paroxysmal nocturnal dyspnea or shortness of breath Dermatological: negative for rash Respiratory: negative for cough or wheezing Urologic: negative for hematuria Abdominal: negative for nausea, vomiting, diarrhea, bright red blood per rectum, melena, or hematemesis Neurologic: negative for visual changes,  syncope, or dizziness All other systems reviewed and are otherwise negative except as noted above.    Blood pressure 124/64, pulse 66, height 4\' 10"  (1.473 m), weight 112 lb (50.8 kg).  General appearance: alert, cooperative and no distress Neck: no JVD Lungs: few crackles Lt base Heart: regular rate and rhythm Extremities: no edema Skin: warm and dry Neurologic: Grossly normal   ASSESSMENT AND PLAN: 2  Fatigue- Patient may be over diuresed- awaiting labs. Beta blocker may also may have been decreased- awaiting office note.  Cardiomyopathy Etiology not determined- presumably NICM. 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  I reassured her that currently she looked good- regular HR- no CHF on exam.   Kerin Ransom PA-C 10/02/2018 8:43 AM   Addendum: I reviewed Dr Thompson Caul office note from 09/26/18. She also has run into the same issue of the patient getting defensive and upset when discussing her medications with her daughter in the room. I didn't see that any of her medications were changed at that office vsist. Her SCr was 1.6 and Dr Tamala Julian recommended she increase her PO fluid intake. I'm not sure why Dianne thought the pt's Coreg had been decreased to once a day. It's difficult to get a clear picture of what medications the patient is taking from the patient or her daughter.

## 2018-10-20 DIAGNOSIS — N183 Chronic kidney disease, stage 3 (moderate): Secondary | ICD-10-CM | POA: Diagnosis not present

## 2018-12-30 ENCOUNTER — Telehealth: Payer: Self-pay | Admitting: Physician Assistant

## 2018-12-30 NOTE — Telephone Encounter (Signed)
Virtual Visit Pre-Appointment Phone Call  Steps For Call:  1. Confirm consent - "In the setting of the current Covid19 crisis, you are scheduled for a (phone or video) visit with your provider on (date) at (time).  Just as we do with many in-office visits, in order for you to participate in this visit, we must obtain consent.  If you'd like, I can send this to your mychart (if signed up) or email for you to review.  Otherwise, I can obtain your verbal consent now.  All virtual visits are billed to your insurance company just like a normal visit would be.  By agreeing to a virtual visit, we'd like you to understand that the technology does not allow for your provider to perform an examination, and thus may limit your provider's ability to fully assess your condition.  Finally, though the technology is pretty good, we cannot assure that it will always work on either your or our end, and in the setting of a video visit, we may have to convert it to a phone-only visit.  In either situation, we cannot ensure that we have a secure connection.  Are you willing to proceed?"  2. Give patient instructions for WebEx download to smartphone as below if video visit  3. Advise patient to be prepared with any vital sign or heart rhythm information, their current medicines, and a piece of paper and pen handy for any instructions they may receive the day of their visit  4. Inform patient they will receive a phone call 15 minutes prior to their appointment time (may be from unknown caller ID) so they should be prepared to answer  5. Confirm that appointment type is correct in Epic appointment notes (video vs telephone)    TELEPHONE CALL NOTE  Katherine Phillips has been deemed a candidate for a follow-up tele-health visit to limit community exposure during the Covid-19 pandemic. I spoke with the patient via phone to ensure availability of phone/video source, confirm preferred email & phone number, and discuss  instructions and expectations.  I reminded Tomasita Morrow to be prepared with any vital sign and/or heart rhythm information that could potentially be obtained via home monitoring, at the time of her visit. I reminded CLOTILE WHITTINGTON to expect a phone call at the time of her visit if her visit.  Did the patient verbally acknowledge consent to treatment? Yes  Therisa Doyne 12/30/2018 5:03 PM  Spoke with pt Daughter, Dianne. Pt does not have BP cuff, but can check weight on day of appointment. Medications/allergies/pharmacy reviewed. Also verbally reviewed consent below.  CONSENT FOR TELE-HEALTH VISIT - PLEASE REVIEW  I hereby voluntarily request, consent and authorize [facility] and its employed or contracted physicians, physician assistants, nurse practitioners or other licensed health care professionals (the Practitioner), to provide me with telemedicine health care services (the Services") as deemed necessary by the treating Practitioner. I acknowledge and consent to receive the Services by the Practitioner via telemedicine. I understand that the telemedicine visit will involve communicating with the Practitioner through live audiovisual communication technology and the disclosure of certain medical information by electronic transmission. I acknowledge that I have been given the opportunity to request an in-person assessment or other available alternative prior to the telemedicine visit and am voluntarily participating in the telemedicine visit.  I understand that I have the right to withhold or withdraw my consent to the use of telemedicine in the course of my care at any time,  without affecting my right to future care or treatment, and that the Practitioner or I may terminate the telemedicine visit at any time. I understand that I have the right to inspect all information obtained and/or recorded in the course of the telemedicine visit and may receive copies of available information for a  reasonable fee.  I understand that some of the potential risks of receiving the Services via telemedicine include:   Delay or interruption in medical evaluation due to technological equipment failure or disruption;  Information transmitted may not be sufficient (e.g. poor resolution of images) to allow for appropriate medical decision making by the Practitioner; and/or   In rare instances, security protocols could fail, causing a breach of personal health information.  Furthermore, I acknowledge that it is my responsibility to provide information about my medical history, conditions and care that is complete and accurate to the best of my ability. I acknowledge that Practitioner's advice, recommendations, and/or decision may be based on factors not within their control, such as incomplete or inaccurate data provided by me or distortions of diagnostic images or specimens that may result from electronic transmissions. I understand that the practice of medicine is not an exact science and that Practitioner makes no warranties or guarantees regarding treatment outcomes. I acknowledge that I will receive a copy of this consent concurrently upon execution via email to the email address I last provided but may also request a printed copy by calling the office of [facility].    I understand that my insurance will be billed for this visit.   I have read or had this consent read to me.  I understand the contents of this consent, which adequately explains the benefits and risks of the Services being provided via telemedicine.   I have been provided ample opportunity to ask questions regarding this consent and the Services and have had my questions answered to my satisfaction.  I give my informed consent for the services to be provided through the use of telemedicine in my medical care  By participating in this telemedicine visit I agree to the above.

## 2018-12-31 ENCOUNTER — Encounter: Payer: Self-pay | Admitting: Physician Assistant

## 2018-12-31 NOTE — Progress Notes (Signed)
Virtual Visit via Telephone Note   This visit type was conducted due to national recommendations for restrictions regarding the COVID-19 Pandemic (e.g. social distancing) in an effort to limit this patient's exposure and mitigate transmission in our community.  Due to her co-morbid illnesses, this patient is at least at moderate risk for complications without adequate follow up.  This format is felt to be most appropriate for this patient at this time.  The patient did not have access to video technology/had technical difficulties with video requiring transitioning to audio format only (telephone).  All issues noted in this document were discussed and addressed.  No physical exam could be performed with this format.  Please refer to the patient's chart for her  consent to telehealth for [facility].  Evaluation Performed:  Follow-up visit  This visit type was conducted due to national recommendations for restrictions regarding the COVID-19 Pandemic (e.g. social distancing).  This format is felt to be most appropriate for this patient at this time.  All issues noted in this document were discussed and addressed.  No physical exam was performed (except for noted visual exam findings with Video Visits).  Please refer to the patient's chart (MyChart message for video visits and phone note for telephone visits) for the patient's consent to telehealth for [facility].  Date:  01/01/2019   ID:  Katherine Phillips, DOB 05/29/1930, MRN [id number]  Patient Location:  Home  Provider location:   Office  PCP:  [name], MD  Cardiologist:  Minus Breeding, MD 07/03/2018 Kerin Ransom, PA-C, 10/02/2018 Electrophysiologist:  None   Chief Complaint:  Follow up  History of Present Illness:    Katherine Phillips is a 85 y.o. female who presents via audio/video conferencing for a telehealth visit today.    85 y.o. yo female who has a hx of dementia (dtr provides care), HTN, S-D-CHF, CKD III, RA.  The patient does not have symptoms concerning for COVID-19 infection (fever, chills, cough, or new shortness of breath).   She has not been out of the house much, daughter cares for her and makes sure she gets her meds. She walks a little in the house.   She is not having LE edema, denies orthopnea or PND. Does not walk very far, but dtr not aware of limitations due to SOB.   No chest pain.   No palpitations.  No presyncope or syncope.   Prior CV studies:   The following studies were reviewed today:  ECHO: 05/14/2017 - Left ventricle: Wall thickness was increased in a pattern of   moderate LVH. Systolic function was moderately reduced. The   estimated ejection fraction was in the range of 35% to 40%.   Diffuse hypokinesis. Doppler parameters are consistent with a   reversible restrictive pattern, indicative of decreased left   ventricular diastolic compliance and/or increased left atrial   pressure (grade 3 diastolic dysfunction). - Aortic valve: Mildly calcified annulus. Moderately thickened,   moderately calcified leaflets. There was mild regurgitation. - Mitral valve: Moderately calcified annulus. There was moderate   regurgitation. - Left atrium: The atrium was severely dilated. - Pulmonic valve: There was moderate regurgitation. - Pulmonary arteries: Systolic pressure was moderately increased.   PA peak pressure: 41 mm Hg (S).  RENAL ARTERY DUPLEX 02/23/2016 Notes Recorded by Minus Breeding, MD on 02/25/2016 at 10:52 AM Some SMA stenosis but no significant renal artery stenosis. She has renal cysts. I would like to repeat a  renal ultrasound in one year to follow up. Please schedule this. Please send result to the patient. A copy of this result note will be sent to   Past Medical History:  Diagnosis Date  . CKD (chronic kidney disease)   . Dementia ([place])   . HTN (hypertension)   . Hyperlipidemia   . RA (rheumatoid arthritis) ([place])    Past Surgical History:   Procedure Laterality Date  . ABDOMINAL HYSTERECTOMY    . CATARACT EXTRACTION    . HIATAL HERNIA REPAIR    . TUBAL LIGATION       Current Meds  Medication Sig  . carvedilol (COREG) 6.25 MG tablet Take 6.25 mg by mouth 2 (two) times daily.  . furosemide (LASIX) 40 MG tablet Take 1 tablet po qd; unless you gain 3lbs in a day then increase to 1tab po bid  . losartan (COZAAR) 50 MG tablet Take 1 tablet (50 mg total) by mouth daily.  . pravastatin (PRAVACHOL) 40 MG tablet Take 40 mg by mouth daily.  Marland Kitchen spironolactone (ALDACTONE) 25 MG tablet TAKE 1/2 TABLET BY MOUTH DAILY     Allergies:   Patient has no known allergies.   Social History   Tobacco Use  . Smoking status: Never Smoker  . Smokeless tobacco: Never Used  Substance Use Topics  . Alcohol use: No    Alcohol/week: 0.0 standard drinks  . Drug use: No     Family Hx: The patient's family history includes Cancer in her sister; Hypertension in her mother.  ROS:   Please see the history of present illness.    All other systems reviewed and are negative.   Labs/Other Tests and Data Reviewed:    Recent Labs: 04/01/2018: BNP 384.1; BUN 33; Creatinine, Ser 1.87; Hemoglobin 13.3; Platelets 214; Potassium 4.9; Sodium 141   Recent Lipid Panel No results found for: CHOL, TRIG, HDL, CHOLHDL, LDLCALC, LDLDIRECT  Wt Readings from Last 3 Encounters:  01/01/19 112 lb (50.8 kg)  10/02/18 112 lb (50.8 kg)  07/03/18 114 lb 9.6 oz (52 kg)     Objective:    Vital Signs:  Ht 4\' 10"  (1.473 m)   Wt 112 lb (50.8 kg)   BMI 23.41 kg/m   85 y.o. female in no obvious distress over the phone, daughter is present.  ASSESSMENT & PLAN:    1.  S-D-CHF:  - wt on home scales is stable.  - dtr assures med compliance. - no new sx - continue current Lasix dose.  2. CKD III - per [facility], Cr 1.6 and K+ 5.0 in January - she is not on K+ supplement and only take 12.5 mg aldactone daily - K+ has been high-nl for a while - recheck at f/u office  visit.  3. HTN:  BP 124/64 at appt in January - no recent check, they do not have a home cuff - continue current therapy  COVID-19 Education: The signs and symptoms of COVID-19 were discussed with the patient and how to seek care for testing (follow up with PCP or arrange E-visit).  The importance of social distancing was discussed today.  Patient Risk:   After full review of this patient's clinical status, I feel that they are at least moderate risk at this time.  Time:   Today, I have spent 18 minutes with the patient with telehealth technology discussing cardiac issues. Another 20 minutes spent reviewing the chart and previous testing.    Medication Adjustments/Labs and Tests Ordered: Current medicines are reviewed  at length with the patient today.  Concerns regarding medicines are outlined above.  Tests Ordered: No orders of the defined types were placed in this encounter.  Medication Changes: No orders of the defined types were placed in this encounter.   Disposition:  Follow up July 14th at 13:20 with Dr Percival Spanish  Signed, Rosaria Ferries, PA-C  01/01/2019 10:35 AM    [place]

## 2019-01-01 ENCOUNTER — Telehealth (INDEPENDENT_AMBULATORY_CARE_PROVIDER_SITE_OTHER): Payer: PPO | Admitting: Physician Assistant

## 2019-01-01 VITALS — Ht <= 58 in | Wt 112.0 lb

## 2019-01-01 DIAGNOSIS — I5042 Chronic combined systolic (congestive) and diastolic (congestive) heart failure: Secondary | ICD-10-CM

## 2019-01-01 DIAGNOSIS — N183 Chronic kidney disease, stage 3 unspecified: Secondary | ICD-10-CM

## 2019-01-01 DIAGNOSIS — I10 Essential (primary) hypertension: Secondary | ICD-10-CM

## 2019-01-01 NOTE — Patient Instructions (Signed)
Medication Instructions:  Rosaria Ferries, PA-C recommends that you continue on your current medications as directed. Please refer to the Current Medication list given to you today.  If you need a refill on your cardiac medications before your next appointment, please call your pharmacy.   Follow-Up: Your physician recommends that you schedule a follow-up appointment in: July with Dr Percival Spanish

## 2019-04-05 NOTE — Progress Notes (Signed)
Cardiology Office Note   Date:  04/07/2019   ID:  Katherine Phillips, DOB October 25, 1929, MRN [id number]  PCP:  [name], MD  Cardiologist:   Minus Breeding, MD   Chief Complaint  Patient presents with  . Cardiomyopathy      History of Present Illness: Katherine Phillips is a 85 y.o. female with a history of HTN and CHF. Prior echo May 2017 showed an EF of 50% with moderate LVH and AOV sclerosis.Echo in Aug 2018 showed her EF to be 35-40% with grade 3 DD. No ischemic work up as undertaken.  She has a history of dementia.    Since I last saw her she has done well.  The patient denies any new symptoms such as chest discomfort, neck or arm discomfort. There has been no new shortness of breath, PND or orthopnea. There have been no reported palpitations, presyncope or syncope.   She makes her bed but otherwise does not do a lot around the house.   Past Medical History:  Diagnosis Date  . CKD (chronic kidney disease)   . Dementia ([place])   . HTN (hypertension)   . Hyperlipidemia   . RA (rheumatoid arthritis) ([place])     Past Surgical History:  Procedure Laterality Date  . ABDOMINAL HYSTERECTOMY    . CATARACT EXTRACTION    . HIATAL HERNIA REPAIR    . TUBAL LIGATION       Current Outpatient Medications  Medication Sig Dispense Refill  . carvedilol (COREG) 6.25 MG tablet Take 6.25 mg by mouth 2 (two) times daily.  1  . furosemide (LASIX) 40 MG tablet Take 1 tablet po qd; unless you gain 3lbs in a day then increase to 1tab po bid 30 tablet 11  . losartan (COZAAR) 50 MG tablet Take 1 tablet (50 mg total) by mouth daily. 30 tablet 11  . pravastatin (PRAVACHOL) 40 MG tablet Take 40 mg by mouth daily.  0  . spironolactone (ALDACTONE) 25 MG tablet TAKE 1/2 TABLET BY MOUTH DAILY 30 tablet 6   No current facility-administered medications for this visit.     Allergies:   Patient has no known allergies.    ROS:  Please see the history of present illness.   Otherwise, review of  systems are positive for none.   All other systems are reviewed and negative.    PHYSICAL EXAM: VS:  BP 129/72   Pulse 62   Wt 117 lb 9.6 oz (53.3 kg)   BMI 24.58 kg/m  , BMI Body mass index is 24.58 kg/m. GENERAL:  Well appearing NECK:  No jugular venous distention, waveform within normal limits, carotid upstroke brisk and symmetric, no bruits, no thyromegaly LUNGS:  Clear to auscultation bilaterally CHEST:  Unremarkable HEART:  PMI not displaced or sustained,S1 and S2 within normal limits, no S3, no S4, no clicks, no rubs, no murmurs ABD:  Flat, positive bowel sounds normal in frequency in pitch, no bruits, no rebound, no guarding, no midline pulsatile mass, no hepatomegaly, no splenomegaly EXT:  2 plus pulses upper and decreased DP/PT, no edema, no cyanosis no clubbing   EKG:  EKG is ordered today. The ekg ordered today demonstrates NSR, rate 66, poor anterior R wave progression.  Nonspecific inferior T wave changes that are not significantly different than previous.   Recent Labs: No results found for requested labs within last 8760 hours.    Lipid Panel No results found for: CHOL, TRIG, HDL, CHOLHDL, VLDL, LDLCALC, LDLDIRECT  Wt Readings from Last 3 Encounters:  04/07/19 117 lb 9.6 oz (53.3 kg)  01/01/19 112 lb (50.8 kg)  10/02/18 112 lb (50.8 kg)      Other studies Reviewed: Additional studies/ records that were reviewed today include: Labs. Review of the above records demonstrates:  Please see elsewhere in the note.     ASSESSMENT AND PLAN:  CARDIOMYOPATHY:     She does have a slightly reduced ejection fraction.  However, she is asymptomatic.  Done well with this and I am going to continue the meds unchanged and she really has class I symptoms.    HTN:   Blood pressure is well tolerated.  No change in therapy.  CKD:   Her creatinine was elevated in July of last year was 1.87 and 1.66 in Jan.  No change in therapy.    Current medicines are reviewed at  length with the patient today.  The patient does not have concerns regarding medicines.  The following changes have been made:  no change  Labs/ tests ordered today include: None No orders of the defined types were placed in this encounter.    Disposition:   FU with with APP in one year.     Signed, Minus Breeding, MD  04/07/2019 3:08 PM    [facility]

## 2019-04-06 ENCOUNTER — Telehealth: Payer: Self-pay | Admitting: Cardiology

## 2019-04-06 NOTE — Telephone Encounter (Signed)
New Message:    Pt's daughter says she have to come in with pt for her appt. She says pt has Dementia.

## 2019-04-06 NOTE — Telephone Encounter (Signed)
   Spoke with pt's daughter who state she has to come to the appointment with her daughter as she has dementia. Pt's daughter made aware to wear a mask as well.  COVID-19 Pre-Screening Questions:  . In the past 7 to 10 days have you had a cough,  shortness of breath, headache, congestion, fever (100 or greater) body aches, chills, sore throat, or sudden loss of taste or sense of smell? No . Have you been around anyone with known Covid 19. NO . Have you been around anyone who is awaiting Covid 19 test results in the past 7 to 10 days? No . Have you been around anyone who has been exposed to Covid 19, or has mentioned symptoms of Covid 19 within the past 7 to 10 days? No  If you have any concerns/questions about symptoms patients report during screening (either on the phone or at threshold). Contact the provider seeing the patient or DOD for further guidance.  If neither are available contact a member of the leadership team.

## 2019-04-06 NOTE — Telephone Encounter (Signed)
I called daughter to confirm pt's appt for 04-07-19 with Dr Percival Spanish...        COVID-19 Pre-Screening Questions:   In the past 7 to 10 days have you had a cough,  shortness of breath, headache, congestion, fever (100 or greater) body aches, chills, sore throat, or sudden loss of taste or sense of smell? no  Have you been around anyone with known Covid 19.  Have you been around anyone who is awaiting Covid 19 test results in the past 7 to 10 days?  no  Have you been around anyone who has been exposed to Covid 19, or has mentioned symptoms of Covid 19 within the past 7 to 10 days? no  If you have any concerns/questions about symptoms patients report during screening (either on the phone or at threshold). Contact the provider seeing the patient or DOD for further guidance.  If neither are available contact a member of the leadership team.

## 2019-04-07 ENCOUNTER — Other Ambulatory Visit: Payer: Self-pay

## 2019-04-07 ENCOUNTER — Ambulatory Visit (INDEPENDENT_AMBULATORY_CARE_PROVIDER_SITE_OTHER): Payer: PPO | Admitting: Cardiology

## 2019-04-07 ENCOUNTER — Encounter: Payer: Self-pay | Admitting: Cardiology

## 2019-04-07 ENCOUNTER — Ambulatory Visit: Payer: PPO | Admitting: Cardiology

## 2019-04-07 VITALS — BP 129/72 | HR 62 | Wt 117.6 lb

## 2019-04-07 DIAGNOSIS — I429 Cardiomyopathy, unspecified: Secondary | ICD-10-CM | POA: Diagnosis not present

## 2019-04-07 DIAGNOSIS — I10 Essential (primary) hypertension: Secondary | ICD-10-CM

## 2019-04-07 NOTE — Patient Instructions (Signed)

## 2019-06-15 DIAGNOSIS — N183 Chronic kidney disease, stage 3 (moderate): Secondary | ICD-10-CM | POA: Diagnosis not present

## 2019-06-15 DIAGNOSIS — I5020 Unspecified systolic (congestive) heart failure: Secondary | ICD-10-CM | POA: Diagnosis not present

## 2019-06-15 DIAGNOSIS — E7800 Pure hypercholesterolemia, unspecified: Secondary | ICD-10-CM | POA: Diagnosis not present

## 2019-06-15 DIAGNOSIS — Z0000 Encounter for general adult medical examination without abnormal findings: Secondary | ICD-10-CM | POA: Diagnosis not present

## 2019-06-15 DIAGNOSIS — Z1389 Encounter for screening for other disorder: Secondary | ICD-10-CM | POA: Diagnosis not present

## 2019-06-15 DIAGNOSIS — I350 Nonrheumatic aortic (valve) stenosis: Secondary | ICD-10-CM | POA: Diagnosis not present

## 2019-06-15 DIAGNOSIS — I10 Essential (primary) hypertension: Secondary | ICD-10-CM | POA: Diagnosis not present

## 2019-07-01 DIAGNOSIS — I10 Essential (primary) hypertension: Secondary | ICD-10-CM | POA: Diagnosis not present

## 2019-07-01 DIAGNOSIS — E7800 Pure hypercholesterolemia, unspecified: Secondary | ICD-10-CM | POA: Diagnosis not present

## 2019-07-01 DIAGNOSIS — Z23 Encounter for immunization: Secondary | ICD-10-CM | POA: Diagnosis not present

## 2020-04-05 NOTE — Progress Notes (Signed)
Cardiology Office Note   Date:  04/07/2020   ID:  Katherine Phillips, DOB [birth year], MRN [id number]  PCP:  [name], MD  Cardiologist:   Minus Breeding, MD   Chief Complaint  Patient presents with  . Coronary Artery Disease      History of Present Illness: Katherine Phillips is a 86 y.o. female with a history of HTN and CHF. Prior echo May 2017 showed an EF of 50% with moderate LVH and AOV sclerosis.Echo in Aug 2018 showed her EF to be 35-40% with grade 3 DD. No ischemic work up as undertaken.  She has a history of dementia.   Since I last saw her she has done well.  The patient denies any new symptoms such as chest discomfort, neck or arm discomfort. There has been no new shortness of breath, PND or orthopnea. There have been no reported palpitations, presyncope or syncope.   She does small chores around the house.   Past Medical History:  Diagnosis Date  . CKD (chronic kidney disease)   . Dementia ([place])   . HTN (hypertension)   . Hyperlipidemia   . RA (rheumatoid arthritis) ([place])     Past Surgical History:  Procedure Laterality Date  . ABDOMINAL HYSTERECTOMY    . CATARACT EXTRACTION    . HIATAL HERNIA REPAIR    . TUBAL LIGATION       Current Outpatient Medications  Medication Sig Dispense Refill  . carvedilol (COREG) 6.25 MG tablet Take 6.25 mg by mouth 2 (two) times daily.  1  . furosemide (LASIX) 40 MG tablet Take 1 tablet po qd; unless you gain 3lbs in a day then increase to 1tab po bid 30 tablet 11  . losartan (COZAAR) 50 MG tablet Take 1 tablet (50 mg total) by mouth daily. 30 tablet 11  . pravastatin (PRAVACHOL) 40 MG tablet Take 40 mg by mouth daily.  0  . spironolactone (ALDACTONE) 25 MG tablet TAKE 1/2 TABLET BY MOUTH DAILY 30 tablet 6   No current facility-administered medications for this visit.    Allergies:   Patient has no known allergies.    ROS:  Please see the history of present illness.   Otherwise, review of systems are positive for  none .   All other systems are reviewed and negative.    PHYSICAL EXAM: VS:  BP 128/64   Pulse 67   Ht 4\' 10"  (1.473 m)   Wt 125 lb (56.7 kg)   SpO2 100%   BMI 26.13 kg/m  , BMI Body mass index is 26.13 kg/m. GENERAL:  Well appearing NECK:  No jugular venous distention, waveform within normal limits, carotid upstroke brisk and symmetric, no bruits, no thyromegaly LUNGS:  Clear to auscultation bilaterally CHEST:  Unremarkable HEART:  PMI not displaced or sustained,S1 and S2 within normal limits, no S3, no S4, no clicks, no rubs, no murmurs ABD:  Flat, positive bowel sounds normal in frequency in pitch, no bruits, no rebound, no guarding, no midline pulsatile mass, no hepatomegaly, no splenomegaly EXT:  2 plus pulses throughout, no edema, no cyanosis no clubbing   EKG:  EKG is  ordered today. The ekg ordered today demonstrates NSR, rate 67, poor anterior R wave progression.  Nonspecific inferior T wave changes that are not significantly different than previous.  LVH  Recent Labs: No results found for requested labs within last 8760 hours.    Lipid Panel No results found for: CHOL, TRIG, HDL, CHOLHDL, VLDL, LDLCALC,  LDLDIRECT    Wt Readings from Last 3 Encounters:  04/07/20 125 lb (56.7 kg)  04/07/19 117 lb 9.6 oz (53.3 kg)  01/01/19 112 lb (50.8 kg)      Other studies Reviewed: Additional studies/ records that were reviewed today include: Labs. Review of the above records demonstrates:  See elsewhere.  ASSESSMENT AND PLAN:  CARDIOMYOPATHY:     She does have a slightly reduced ejection fraction.  However, she feels great and does not want further med titration.  She is certainly euvolemic.  No change in therapy.  HTN:   Blood pressure at target.  No change in therapy.  CKD:   Her creatinine was more elevated at 1.97 in April.  This was up from her previous of 1.87.  I discussed this with her daughter.  This is followed by her primary provider.  I do think she is  tolerating the meds as listed.  DYSLIPIDEMIA: She does not want to change statins despite the fact that her LDL was 208.  Her daughter says she has not tolerated med titration before.  They are not interested in other therapies.    Current medicines are reviewed at length with the patient today.  The patient does not have concerns regarding medicines.  The following changes have been made:  None  Labs/ tests ordered today include: None  Orders Placed This Encounter  Procedures  . EKG 12-Lead     Disposition:   FU with with APP in one year.     Signed, Minus Breeding, MD  04/07/2020 9:25 AM    [facility]

## 2020-04-07 ENCOUNTER — Encounter: Payer: Self-pay | Admitting: Cardiology

## 2020-04-07 ENCOUNTER — Other Ambulatory Visit: Payer: Self-pay

## 2020-04-07 ENCOUNTER — Ambulatory Visit (INDEPENDENT_AMBULATORY_CARE_PROVIDER_SITE_OTHER): Payer: Medicare Other | Admitting: Cardiology

## 2020-04-07 VITALS — BP 128/64 | HR 67 | Ht <= 58 in | Wt 125.0 lb

## 2020-04-07 DIAGNOSIS — I10 Essential (primary) hypertension: Secondary | ICD-10-CM

## 2020-04-07 DIAGNOSIS — N1832 Chronic kidney disease, stage 3b: Secondary | ICD-10-CM

## 2020-04-07 DIAGNOSIS — I429 Cardiomyopathy, unspecified: Secondary | ICD-10-CM

## 2020-04-07 NOTE — Patient Instructions (Signed)
Medication Instructions:  NO CHANGE *If you need a refill on your cardiac medications before your next appointment, please call your pharmacy*   Lab Work: If you have labs (blood work) drawn today and your tests are completely normal, you will receive your results only by: [place] MyChart Message (if you have MyChart) OR . A paper copy in the mail If you have any lab test that is abnormal or we need to change your treatment, we will call you to review the results.  Follow-Up: At [facility], you and your health needs are our priority.  As part of our continuing mission to provide you with exceptional heart care, we have created designated Provider Care Teams.  These Care Teams include your primary Cardiologist (physician) and Advanced Practice Providers (APPs -  Physician Assistants and Nurse Practitioners) who all work together to provide you with the care you need, when you need it.  We recommend signing up for the patient portal called "MyChart".  Sign up information is provided on this After Visit Summary.  MyChart is used to connect with patients for Virtual Visits (Telemedicine).  Patients are able to view lab/test results, encounter notes, upcoming appointments, etc.  Non-urgent messages can be sent to your provider as well.   To learn more about what you can do with MyChart, go to [email or web address].    Your next appointment:   12 month(s)  The format for your next appointment:   Either In Person or Virtual  Provider:   You may see one of the following Advanced Practice Providers on your designated Care Team:    Rosaria Ferries, PA-C  Jory Sims, DNP, ANP  Cadence Kathlen Mody, PA-C

## 2020-12-29 DIAGNOSIS — N184 Chronic kidney disease, stage 4 (severe): Secondary | ICD-10-CM | POA: Diagnosis not present

## 2020-12-29 DIAGNOSIS — I10 Essential (primary) hypertension: Secondary | ICD-10-CM | POA: Diagnosis not present

## 2020-12-29 DIAGNOSIS — I350 Nonrheumatic aortic (valve) stenosis: Secondary | ICD-10-CM | POA: Diagnosis not present

## 2021-01-31 DIAGNOSIS — I10 Essential (primary) hypertension: Secondary | ICD-10-CM | POA: Diagnosis not present

## 2021-01-31 DIAGNOSIS — M8580 Other specified disorders of bone density and structure, unspecified site: Secondary | ICD-10-CM | POA: Diagnosis not present

## 2021-01-31 DIAGNOSIS — M0540 Rheumatoid myopathy with rheumatoid arthritis of unspecified site: Secondary | ICD-10-CM | POA: Diagnosis not present

## 2021-01-31 DIAGNOSIS — M159 Polyosteoarthritis, unspecified: Secondary | ICD-10-CM | POA: Diagnosis not present

## 2021-01-31 DIAGNOSIS — N183 Chronic kidney disease, stage 3 unspecified: Secondary | ICD-10-CM | POA: Diagnosis not present

## 2021-01-31 DIAGNOSIS — N184 Chronic kidney disease, stage 4 (severe): Secondary | ICD-10-CM | POA: Diagnosis not present

## 2021-01-31 DIAGNOSIS — I5020 Unspecified systolic (congestive) heart failure: Secondary | ICD-10-CM | POA: Diagnosis not present

## 2021-01-31 DIAGNOSIS — E7800 Pure hypercholesterolemia, unspecified: Secondary | ICD-10-CM | POA: Diagnosis not present

## 2021-02-14 DIAGNOSIS — I129 Hypertensive chronic kidney disease with stage 1 through stage 4 chronic kidney disease, or unspecified chronic kidney disease: Secondary | ICD-10-CM | POA: Diagnosis not present

## 2021-02-14 DIAGNOSIS — N189 Chronic kidney disease, unspecified: Secondary | ICD-10-CM | POA: Diagnosis not present

## 2021-02-14 DIAGNOSIS — N183 Chronic kidney disease, stage 3 unspecified: Secondary | ICD-10-CM | POA: Diagnosis not present

## 2021-04-20 DIAGNOSIS — M159 Polyosteoarthritis, unspecified: Secondary | ICD-10-CM | POA: Diagnosis not present

## 2021-04-20 DIAGNOSIS — M8580 Other specified disorders of bone density and structure, unspecified site: Secondary | ICD-10-CM | POA: Diagnosis not present

## 2021-04-20 DIAGNOSIS — M0540 Rheumatoid myopathy with rheumatoid arthritis of unspecified site: Secondary | ICD-10-CM | POA: Diagnosis not present

## 2021-04-20 DIAGNOSIS — I10 Essential (primary) hypertension: Secondary | ICD-10-CM | POA: Diagnosis not present

## 2021-04-20 DIAGNOSIS — I5020 Unspecified systolic (congestive) heart failure: Secondary | ICD-10-CM | POA: Diagnosis not present

## 2021-04-20 DIAGNOSIS — E7800 Pure hypercholesterolemia, unspecified: Secondary | ICD-10-CM | POA: Diagnosis not present

## 2021-04-20 DIAGNOSIS — N184 Chronic kidney disease, stage 4 (severe): Secondary | ICD-10-CM | POA: Diagnosis not present

## 2021-05-31 NOTE — Progress Notes (Signed)
Cardiology Office Note   Date:  06/02/2021   ID:  Katherine Phillips, DOB 10-29-1929, MRN [id number]  PCP:  [name], MD  Cardiologist:  Percival Spanish  No chief complaint on file.    History of Present Illness: Katherine Phillips is a 87 y.o. female who presents for ongoing assessment and management of hypertension, and systolic CHF per echo in August of 2018, with Grade 3 Diastolic Dysfunction, and dyslipidemia. Other history includes dementia. She was last seen by Dr, Percival Spanish on 04/07/2020. She was stable and without any new complaints.   Katherine Phillips comes today with her daughter [name].  She does not express any symptoms other than pain in her hands due to rheumatoid arthritic pain and deformity.  She is not very active but does go to the grocery store sometimes with her daughter, or walks around outside in her yard.  Her daughter make sure she gets her medications every day and takes her to her physician appointments.  She is due to see her primary care next month for routine labs.  She denies any chest pain, shortness of breath, dizziness, or palpitations.  Her daughter concurs with this as she does not see anything that she is overly concerned about concerning how her mother is doing.  Past Medical History:  Diagnosis Date   CKD (chronic kidney disease)    Dementia ([facility])    HTN (hypertension)    Hyperlipidemia    RA (rheumatoid arthritis) ([facility])     Past Surgical History:  Procedure Laterality Date   ABDOMINAL HYSTERECTOMY     CATARACT EXTRACTION     HIATAL HERNIA REPAIR     TUBAL LIGATION       Current Outpatient Medications  Medication Sig Dispense Refill   carvedilol (COREG) 6.25 MG tablet Take 6.25 mg by mouth 2 (two) times daily.  1   losartan (COZAAR) 50 MG tablet Take 1 tablet (50 mg total) by mouth daily. 30 tablet 11   pravastatin (PRAVACHOL) 40 MG tablet Take 40 mg by mouth daily.  0   spironolactone (ALDACTONE) 25 MG tablet TAKE 1/2 TABLET BY MOUTH DAILY 30 tablet 6    No current facility-administered medications for this visit.    Allergies:   Patient has no known allergies.    Social History:  The patient  reports that she has never smoked. She has never used smokeless tobacco. She reports that she does not drink alcohol and does not use drugs.   Family History:  The patient's family history includes Cancer in her sister; Hypertension in her mother.    ROS: All other systems are reviewed and negative. Unless otherwise mentioned in H&P    PHYSICAL EXAM: VS:  BP (!) 152/80 (BP Location: Left Arm, Patient Position: Sitting, Cuff Size: Normal)   Pulse 63   Ht '4\' 10"'$  (1.473 m)   Wt 115 lb 6.4 oz (52.3 kg)   SpO2 100%   BMI 24.12 kg/m  , BMI Body mass index is 24.12 kg/m. GEN: Well nourished, well developed, in no acute distress HEENT: normal Neck: no JVD, carotid bruits, or masses Cardiac: RRR; 2/6 harsh systolic murmur heard best at the left sternal border, rubs, or gallops,no edema pulses are diminished posterior tibial.  Respiratory:  Clear to auscultation bilaterally, normal work of breathing GI: soft, nontender, nondistended, + BS MS: Arthritic deformity, of her hands, no atrophy, uses cane for ambulation. Skin: warm and dry, no rash Neuro:  Strength is diminished and normal for her age,  and sensation are intact Psych: euthymic mood, full affect   EKG:  EKG is ordered today. The ekg ordered today demonstrates (personally reviewed) normal sinus rhythm with LVH and left axis deviation heart rate of 63 bpm.   Recent Labs: No results found for requested labs within last 8760 hours.    Lipid Panel No results found for: CHOL, TRIG, HDL, CHOLHDL, VLDL, LDLCALC, LDLDIRECT    Wt Readings from Last 3 Encounters:  06/02/21 115 lb 6.4 oz (52.3 kg)  04/07/20 125 lb (56.7 kg)  04/07/19 117 lb 9.6 oz (53.3 kg)      Other studies Reviewed: Echocardiogram 05-23-2017 Left ventricle: Wall thickness was increased in a pattern of     moderate LVH. Systolic function was moderately reduced. The    estimated ejection fraction was in the range of 35% to 40%.    Diffuse hypokinesis. Doppler parameters are consistent with a    reversible restrictive pattern, indicative of decreased left    ventricular diastolic compliance and/or increased left atrial    pressure (grade 3 diastolic dysfunction).  - Aortic valve: Mildly calcified annulus. Moderately thickened,    moderately calcified leaflets. There was mild regurgitation.  - Mitral valve: Moderately calcified annulus. There was moderate    regurgitation.  - Left atrium: The atrium was severely dilated.  - Pulmonic valve: There was moderate regurgitation.  - Pulmonary arteries: Systolic pressure was moderately increased.    PA peak pressure: 41 mm Hg (S).    ASSESSMENT AND PLAN:  1.  Hypertension: Blood pressure is elevated this morning but she has not taken her medications yet as they were in a rush to come to the appointment.  Her daughter, [name], make sure she gets her medications every day.  Her primary care provides refills.  I will not make any changes or do any labs today as she is due for them in 1 month through primary care.  2.  Restrictive cardiomyopathy: The patient denies any shortness of breath, no evidence of fluid overload.  We will continue current medications as she is tolerating them.  It is important to keep her blood pressure under control and her daughter verbalizes understanding.  3.  Valvular heart disease: With aortic and mitral valve with moderate to mild regurgitation.  She is asymptomatic.  Will need follow-up echocardiogram prior to next appointment.  4.  Hyperlipidemia: Continue Pravachol 40 mg daily.  Goal of LDL less than 100.   Current medicines are reviewed at length with the patient today.  I have spent 25 min's dedicated to the care of this patient on the date of this encounter to include pre-visit review of records, assessment, management  and diagnostic testing,with shared decision making.  Labs/ tests ordered today include: None Phill Myron. [place] Pugh, ANP, [facility]   06/02/2021 8:43 AM    [address] Office [phone] Fax [phone]  Notice: This dictation was prepared with Dragon dictation along with smaller phrase technology. Any transcriptional errors that result from this process are unintentional and may not be corrected upon review.

## 2021-06-02 ENCOUNTER — Encounter: Payer: Self-pay | Admitting: Adult Health

## 2021-06-02 ENCOUNTER — Ambulatory Visit (INDEPENDENT_AMBULATORY_CARE_PROVIDER_SITE_OTHER): Payer: Medicare Other | Admitting: Adult Health

## 2021-06-02 ENCOUNTER — Other Ambulatory Visit: Payer: Self-pay

## 2021-06-02 VITALS — BP 152/80 | HR 63 | Ht <= 58 in | Wt 115.4 lb

## 2021-06-02 DIAGNOSIS — E7800 Pure hypercholesterolemia, unspecified: Secondary | ICD-10-CM

## 2021-06-02 DIAGNOSIS — I110 Hypertensive heart disease with heart failure: Secondary | ICD-10-CM

## 2021-06-02 DIAGNOSIS — I5022 Chronic systolic (congestive) heart failure: Secondary | ICD-10-CM | POA: Diagnosis not present

## 2021-06-02 DIAGNOSIS — I10 Essential (primary) hypertension: Secondary | ICD-10-CM | POA: Diagnosis not present

## 2021-06-02 DIAGNOSIS — I429 Cardiomyopathy, unspecified: Secondary | ICD-10-CM | POA: Diagnosis not present

## 2021-06-02 NOTE — Patient Instructions (Signed)
Medication Instructions:   Your physician recommends that you continue on your current medications as directed. Please refer to the Current Medication list given to you today.  *If you need a refill on your cardiac medications before your next appointment, please call your pharmacy*   Lab Work: [place]   If you have labs (blood work) drawn today and your tests are completely normal, you will receive your results only by: [place] (if you have MyChart) OR A paper copy in the mail If you have any lab test that is abnormal or we need to change your treatment, we will call you to review the results.   Testing/Procedures: NONE ORDERED  TODAY     Follow-Up: At [facility], you and your health needs are our priority.  As part of our continuing mission to provide you with exceptional heart care, we have created designated Provider Care Teams.  These Care Teams include your primary Cardiologist (physician) and Advanced Practice Providers (APPs -  Physician Assistants and Nurse Practitioners) who all work together to provide you with the care you need, when you need it.  We recommend signing up for the patient portal called "MyChart".  Sign up information is provided on this After Visit Summary.  MyChart is used to connect with patients for Virtual Visits (Telemedicine).  Patients are able to view lab/test results, encounter notes, upcoming appointments, etc.  Non-urgent messages can be sent to your provider as well.   To learn more about what you can do with MyChart, go to [email or web address].    Your next appointment:   1 year(s)  The format for your next appointment:   In Person  Provider:   You may see Minus Breeding, MD or one of the following Advanced Practice Providers on your designated Care Team:   Rosaria Ferries, PA-C Caron Presume, PA-C Jory Sims, DNP, ANP   Other Instructions

## 2021-07-06 DIAGNOSIS — N184 Chronic kidney disease, stage 4 (severe): Secondary | ICD-10-CM | POA: Diagnosis not present

## 2021-07-06 DIAGNOSIS — E7800 Pure hypercholesterolemia, unspecified: Secondary | ICD-10-CM | POA: Diagnosis not present

## 2021-07-06 DIAGNOSIS — I10 Essential (primary) hypertension: Secondary | ICD-10-CM | POA: Diagnosis not present

## 2021-07-06 DIAGNOSIS — M8580 Other specified disorders of bone density and structure, unspecified site: Secondary | ICD-10-CM | POA: Diagnosis not present

## 2021-07-06 DIAGNOSIS — M159 Polyosteoarthritis, unspecified: Secondary | ICD-10-CM | POA: Diagnosis not present

## 2021-07-06 DIAGNOSIS — M0540 Rheumatoid myopathy with rheumatoid arthritis of unspecified site: Secondary | ICD-10-CM | POA: Diagnosis not present

## 2021-07-11 DIAGNOSIS — E7800 Pure hypercholesterolemia, unspecified: Secondary | ICD-10-CM | POA: Diagnosis not present

## 2021-07-11 DIAGNOSIS — E213 Hyperparathyroidism, unspecified: Secondary | ICD-10-CM | POA: Diagnosis not present

## 2021-07-11 DIAGNOSIS — I10 Essential (primary) hypertension: Secondary | ICD-10-CM | POA: Diagnosis not present

## 2021-07-11 DIAGNOSIS — R011 Cardiac murmur, unspecified: Secondary | ICD-10-CM | POA: Diagnosis not present

## 2021-07-11 DIAGNOSIS — I350 Nonrheumatic aortic (valve) stenosis: Secondary | ICD-10-CM | POA: Diagnosis not present

## 2021-07-11 DIAGNOSIS — M0540 Rheumatoid myopathy with rheumatoid arthritis of unspecified site: Secondary | ICD-10-CM | POA: Diagnosis not present

## 2021-07-11 DIAGNOSIS — Z23 Encounter for immunization: Secondary | ICD-10-CM | POA: Diagnosis not present

## 2021-07-11 DIAGNOSIS — Z1389 Encounter for screening for other disorder: Secondary | ICD-10-CM | POA: Diagnosis not present

## 2021-07-11 DIAGNOSIS — N184 Chronic kidney disease, stage 4 (severe): Secondary | ICD-10-CM | POA: Diagnosis not present

## 2021-07-11 DIAGNOSIS — F01B0 Vascular dementia, moderate, without behavioral disturbance, psychotic disturbance, mood disturbance, and anxiety: Secondary | ICD-10-CM | POA: Diagnosis not present

## 2021-07-11 DIAGNOSIS — I5020 Unspecified systolic (congestive) heart failure: Secondary | ICD-10-CM | POA: Diagnosis not present

## 2021-07-11 DIAGNOSIS — Z0000 Encounter for general adult medical examination without abnormal findings: Secondary | ICD-10-CM | POA: Diagnosis not present

## 2021-07-18 DIAGNOSIS — E7800 Pure hypercholesterolemia, unspecified: Secondary | ICD-10-CM | POA: Diagnosis not present

## 2021-07-25 DIAGNOSIS — I10 Essential (primary) hypertension: Secondary | ICD-10-CM | POA: Diagnosis not present

## 2021-07-25 DIAGNOSIS — N2581 Secondary hyperparathyroidism of renal origin: Secondary | ICD-10-CM | POA: Diagnosis not present

## 2021-07-25 DIAGNOSIS — N1831 Chronic kidney disease, stage 3a: Secondary | ICD-10-CM | POA: Diagnosis not present

## 2021-08-29 DIAGNOSIS — I129 Hypertensive chronic kidney disease with stage 1 through stage 4 chronic kidney disease, or unspecified chronic kidney disease: Secondary | ICD-10-CM | POA: Diagnosis not present

## 2021-08-29 DIAGNOSIS — N183 Chronic kidney disease, stage 3 unspecified: Secondary | ICD-10-CM | POA: Diagnosis not present

## 2021-11-30 DIAGNOSIS — I10 Essential (primary) hypertension: Secondary | ICD-10-CM | POA: Diagnosis not present

## 2021-11-30 DIAGNOSIS — I350 Nonrheumatic aortic (valve) stenosis: Secondary | ICD-10-CM | POA: Diagnosis not present

## 2021-11-30 DIAGNOSIS — N184 Chronic kidney disease, stage 4 (severe): Secondary | ICD-10-CM | POA: Diagnosis not present

## 2021-11-30 DIAGNOSIS — N2581 Secondary hyperparathyroidism of renal origin: Secondary | ICD-10-CM | POA: Diagnosis not present

## 2022-05-15 DIAGNOSIS — I129 Hypertensive chronic kidney disease with stage 1 through stage 4 chronic kidney disease, or unspecified chronic kidney disease: Secondary | ICD-10-CM | POA: Diagnosis not present

## 2022-05-15 DIAGNOSIS — N183 Chronic kidney disease, stage 3 unspecified: Secondary | ICD-10-CM | POA: Diagnosis not present

## 2022-06-21 DIAGNOSIS — E785 Hyperlipidemia, unspecified: Secondary | ICD-10-CM | POA: Insufficient documentation

## 2022-06-21 NOTE — Progress Notes (Signed)
Cardiology Office Note   Date:  06/22/2022   ID:  Katherine Phillips, DOB [birth year], MRN [id number]  PCP:  [name], MD  Cardiologist:   Minus Breeding, MD   Chief Complaint  Patient presents with   Cardiomyopathy      History of Present Illness: Katherine Phillips is a 88 y.o. female with a history of HTN and CHF. Prior echo May 2017 showed an EF of 50% with moderate LVH and AOV sclerosis. Echo in Aug 2018 showed her EF to be 35-40% with grade 3 DD. No ischemic work up as undertaken.   She has a history of dementia.  Since I last saw her she has done well.  The patient denies any new symptoms such as chest discomfort, neck or arm discomfort. There has been no new shortness of breath, PND or orthopnea. There have been no reported palpitations, presyncope or syncope.  She actually lives by herself but her daughter checks on her frequently.  Her daughter says that she is doing well.   Past Medical History:  Diagnosis Date   CKD (chronic kidney disease)    Dementia (HCC)    HTN (hypertension)    Hyperlipidemia    RA (rheumatoid arthritis) (HCC)     Past Surgical History:  Procedure Laterality Date   ABDOMINAL HYSTERECTOMY     CATARACT EXTRACTION     HIATAL HERNIA REPAIR     TUBAL LIGATION       Current Outpatient Medications  Medication Sig Dispense Refill   carvedilol (COREG) 6.25 MG tablet Take 6.25 mg by mouth 2 (two) times daily.  1   furosemide (LASIX) 40 MG tablet Take 40 mg by mouth daily.     spironolactone (ALDACTONE) 25 MG tablet TAKE 1/2 TABLET BY MOUTH DAILY 30 tablet 6   No current facility-administered medications for this visit.    Allergies:   Patient has no known allergies.    ROS:  Please see the history of present illness.   Otherwise, review of systems are positive for none.   All other systems are reviewed and negative.    PHYSICAL EXAM: VS:  BP (!) 152/72   Pulse 68   Ht 4\' 10"  (1.473 m)   Wt 108 lb 3.2 oz (49.1 kg)   SpO2 100%   BMI  22.61 kg/m  , BMI Body mass index is 22.61 kg/m. GENERAL:  Well appearing NECK:  No jugular venous distention, waveform within normal limits, carotid upstroke brisk and symmetric, no bruits, no thyromegaly LUNGS:  Clear to auscultation bilaterally CHEST:  Unremarkable HEART:  PMI not displaced or sustained,S1 and S2 within normal limits, no S3, no S4, no clicks, no rubs, no murmurs ABD:  Flat, positive bowel sounds normal in frequency in pitch, no bruits, no rebound, no guarding, no midline pulsatile mass, no hepatomegaly, no splenomegaly EXT:  2 plus pulses throughout, no edema, no cyanosis no clubbing   EKG:  EKG is  ordered today. The ekg ordered today demonstrates NSR, rate 68, poor anterior R wave progression.  Nonspecific inferior T wave changes that are not significantly different than previous.  LVH  Recent Labs: No results found for requested labs within last 365 days.    Lipid Panel No results found for: "CHOL", "TRIG", "HDL", "CHOLHDL", "VLDL", "LDLCALC", "LDLDIRECT"    Wt Readings from Last 3 Encounters:  06/22/22 108 lb 3.2 oz (49.1 kg)  06/02/21 115 lb 6.4 oz (52.3 kg)  04/07/20 125 lb (56.7 kg)  Other studies Reviewed: Additional studies/ records that were reviewed today include: Labs. Review of the above records demonstrates:  See elsewhere.  ASSESSMENT AND PLAN:  CARDIOMYOPATHY:    She has a slightly reduced ejection fraction.  She does not want further med titration.  She and her family went conservative therapy and she is doing quite well.   HTN:   The blood pressure is slightly elevated.  Her family does agree to check her blood pressure few times a week at home.    CKD:   Her creatinine was mildly elevated but she follows with Dr. Moshe Cipro.  Is been relatively stable.  No change in therapy.  We are avoiding ACE and ARB.   DYSLIPIDEMIA:    She does not want to take statins.  I think this is reasonable given her advanced age.  No change in  therapy.   Current medicines are reviewed at length with the patient today.  The patient does not have concerns regarding medicines.  The following changes have been made:  None  Labs/ tests ordered today include: None  Orders Placed This Encounter  Procedures   EKG 12-Lead     Disposition:   FU with with me in 1 year   Signed, Minus Breeding, MD  06/22/2022 10:04 AM    [place]

## 2022-06-22 ENCOUNTER — Encounter: Payer: Self-pay | Admitting: Cardiology

## 2022-06-22 ENCOUNTER — Ambulatory Visit: Payer: Medicare Other | Attending: Cardiology | Admitting: Cardiology

## 2022-06-22 VITALS — BP 152/72 | HR 68 | Ht <= 58 in | Wt 108.2 lb

## 2022-06-22 DIAGNOSIS — N1832 Chronic kidney disease, stage 3b: Secondary | ICD-10-CM

## 2022-06-22 DIAGNOSIS — E785 Hyperlipidemia, unspecified: Secondary | ICD-10-CM

## 2022-06-22 DIAGNOSIS — I420 Dilated cardiomyopathy: Secondary | ICD-10-CM | POA: Diagnosis not present

## 2022-06-22 DIAGNOSIS — I10 Essential (primary) hypertension: Secondary | ICD-10-CM

## 2022-06-22 NOTE — Patient Instructions (Signed)

## 2022-08-06 DIAGNOSIS — F01B0 Vascular dementia, moderate, without behavioral disturbance, psychotic disturbance, mood disturbance, and anxiety: Secondary | ICD-10-CM | POA: Diagnosis not present

## 2022-08-06 DIAGNOSIS — Z0000 Encounter for general adult medical examination without abnormal findings: Secondary | ICD-10-CM | POA: Diagnosis not present

## 2022-08-06 DIAGNOSIS — I10 Essential (primary) hypertension: Secondary | ICD-10-CM | POA: Diagnosis not present

## 2022-08-06 DIAGNOSIS — E7800 Pure hypercholesterolemia, unspecified: Secondary | ICD-10-CM | POA: Diagnosis not present

## 2022-08-06 DIAGNOSIS — Z23 Encounter for immunization: Secondary | ICD-10-CM | POA: Diagnosis not present

## 2022-08-06 DIAGNOSIS — N184 Chronic kidney disease, stage 4 (severe): Secondary | ICD-10-CM | POA: Diagnosis not present

## 2022-08-06 DIAGNOSIS — E210 Primary hyperparathyroidism: Secondary | ICD-10-CM | POA: Diagnosis not present

## 2022-08-20 DIAGNOSIS — S79911A Unspecified injury of right hip, initial encounter: Secondary | ICD-10-CM | POA: Diagnosis not present

## 2022-09-04 DIAGNOSIS — Z9181 History of falling: Secondary | ICD-10-CM | POA: Diagnosis not present

## 2022-09-04 DIAGNOSIS — R29898 Other symptoms and signs involving the musculoskeletal system: Secondary | ICD-10-CM | POA: Diagnosis not present

## 2022-09-04 DIAGNOSIS — R42 Dizziness and giddiness: Secondary | ICD-10-CM | POA: Diagnosis not present

## 2022-09-11 DIAGNOSIS — R296 Repeated falls: Secondary | ICD-10-CM | POA: Diagnosis not present

## 2022-09-11 DIAGNOSIS — M25651 Stiffness of right hip, not elsewhere classified: Secondary | ICD-10-CM | POA: Diagnosis not present

## 2022-09-11 DIAGNOSIS — M25551 Pain in right hip: Secondary | ICD-10-CM | POA: Diagnosis not present

## 2022-09-11 DIAGNOSIS — M6281 Muscle weakness (generalized): Secondary | ICD-10-CM | POA: Diagnosis not present

## 2022-09-11 DIAGNOSIS — R42 Dizziness and giddiness: Secondary | ICD-10-CM | POA: Diagnosis not present

## 2022-09-11 DIAGNOSIS — R262 Difficulty in walking, not elsewhere classified: Secondary | ICD-10-CM | POA: Diagnosis not present

## 2022-11-20 DIAGNOSIS — N183 Chronic kidney disease, stage 3 unspecified: Secondary | ICD-10-CM | POA: Diagnosis not present

## 2022-11-20 DIAGNOSIS — I129 Hypertensive chronic kidney disease with stage 1 through stage 4 chronic kidney disease, or unspecified chronic kidney disease: Secondary | ICD-10-CM | POA: Diagnosis not present

## 2023-05-20 DIAGNOSIS — R350 Frequency of micturition: Secondary | ICD-10-CM | POA: Diagnosis not present

## 2023-05-20 DIAGNOSIS — N184 Chronic kidney disease, stage 4 (severe): Secondary | ICD-10-CM | POA: Diagnosis not present

## 2023-05-20 DIAGNOSIS — I129 Hypertensive chronic kidney disease with stage 1 through stage 4 chronic kidney disease, or unspecified chronic kidney disease: Secondary | ICD-10-CM | POA: Diagnosis not present

## 2023-05-20 DIAGNOSIS — D631 Anemia in chronic kidney disease: Secondary | ICD-10-CM | POA: Diagnosis not present

## 2023-05-20 DIAGNOSIS — N189 Chronic kidney disease, unspecified: Secondary | ICD-10-CM | POA: Diagnosis not present

## 2023-08-15 NOTE — Progress Notes (Signed)
  Cardiology Office Note:   Date:  08/16/2023  ID:  Katherine Phillips, DOB [birth year], MRN [id number] PCP: Merri Brunette, MD  [facility] Providers Cardiologist:  Rollene Rotunda, MD {  History of Present Illness:   Katherine Phillips is a 89 y.o. female  with a history of HTN and CHF. Prior echo May 2017 showed an EF of 50% with moderate LVH and AOV sclerosis. Echo in Aug 2018 showed her EF to be 35-40% with grade 3 DD. No ischemic work up as undertaken.   She has a history of dementia.  Since I last saw her she has had no new symptoms.  The patient denies any new symptoms such as chest discomfort, neck or arm discomfort. There has been no new shortness of breath, PND or orthopnea. There have been no reported palpitations, presyncope or syncope.   She has severe dementia.     She gets around slowly with a walker.   ROS: As stated in the HPI and negative for all other systems.  Studies Reviewed:    EKG:   EKG Interpretation Date/Time:  Friday August 16 2023 10:00:41 EST Ventricular Rate:  69 PR Interval:  202 QRS Duration:  110 QT Interval:  418 QTC Calculation: 447 R Axis:   -31  Text Interpretation: Normal sinus rhythm Left axis deviation Incomplete left bundle branch block Left ventricular hypertrophy with repolarization abnormality ( Cornell product , Romhilt-Estes ) When compared with ECG of 16-Aug-2023 09:59, No significant change was found Confirmed by Rollene Rotunda ([id number]) on 08/16/2023 10:30:14 AM      Risk Assessment/Calculations:              Physical Exam:   VS:  BP 110/70 (BP Location: Left Arm, Patient Position: Sitting, Cuff Size: Normal)   Pulse 69   Ht 5\' 1"  (1.549 m)   Wt 107 lb (48.5 kg)   SpO2 94%   BMI 20.22 kg/m    Wt Readings from Last 3 Encounters:  08/16/23 107 lb (48.5 kg)  06/22/22 108 lb 3.2 oz (49.1 kg)  06/02/21 115 lb 6.4 oz (52.3 kg)     GEN: Well nourished, well developed in no acute distress NECK: No JVD; No carotid  bruits CARDIAC: RRR, no murmurs, rubs, gallops RESPIRATORY:  Clear to auscultation without rales, wheezing or rhonchi  ABDOMEN: Soft, non-tender, non-distended EXTREMITIES:  No edema; No deformity   ASSESSMENT AND PLAN:   CARDIOMYOPATHY:    He seems to be euvolemic.  No change in therapy.  She has not wanted further med titration.  She wants more conservative therapy.  I will see her as needed because the strips are difficult for her.  Her daughter will let me know if she has any shortness of breath in the future.   HTN:   The blood pressure being managed in the context of treating her heart failure.  No change in therapy.   CKD: She is being followed by nephrology and has stable renal function.  No change in therapy.  DYSLIPIDEMIA:    She has not wanted to take statins.  Again she wants conservative therapy.  No change in therapy.   Follow up with me as needed.  As far  Signed, Rollene Rotunda, MD

## 2023-08-16 ENCOUNTER — Encounter: Payer: Self-pay | Admitting: Cardiology

## 2023-08-16 ENCOUNTER — Ambulatory Visit: Payer: Medicare Other | Attending: Cardiology | Admitting: Cardiology

## 2023-08-16 VITALS — BP 110/70 | HR 69 | Ht 61.0 in | Wt 107.0 lb

## 2023-08-16 DIAGNOSIS — E785 Hyperlipidemia, unspecified: Secondary | ICD-10-CM

## 2023-08-16 DIAGNOSIS — I420 Dilated cardiomyopathy: Secondary | ICD-10-CM

## 2023-08-16 DIAGNOSIS — I10 Essential (primary) hypertension: Secondary | ICD-10-CM

## 2023-08-16 DIAGNOSIS — N183 Chronic kidney disease, stage 3 unspecified: Secondary | ICD-10-CM

## 2023-08-16 NOTE — Patient Instructions (Signed)
Medication Instructions:  Your physician recommends that you continue on your current medications as directed. Please refer to the Current Medication list given to you today.  *If you need a refill on your cardiac medications before your next appointment, please call your pharmacy*  Follow-Up: At [facility], you and your health needs are our priority.  As part of our continuing mission to provide you with exceptional heart care, we have created designated Provider Care Teams.  These Care Teams include your primary Cardiologist (physician) and Advanced Practice Providers (APPs -  Physician Assistants and Nurse Practitioners) who all work together to provide you with the care you need, when you need it.  We recommend signing up for the patient portal called "MyChart".  Sign up information is provided on this After Visit Summary.  MyChart is used to connect with patients for Virtual Visits (Telemedicine).  Patients are able to view lab/test results, encounter notes, upcoming appointments, etc.  Non-urgent messages can be sent to your provider as well.   To learn more about what you can do with MyChart, go to [email or web address].    Your next appointment:    As needed  Provider:   Rollene Rotunda, MD

## 2023-08-29 DIAGNOSIS — Z23 Encounter for immunization: Secondary | ICD-10-CM | POA: Diagnosis not present

## 2023-08-29 DIAGNOSIS — I350 Nonrheumatic aortic (valve) stenosis: Secondary | ICD-10-CM | POA: Diagnosis not present

## 2023-08-29 DIAGNOSIS — Z0000 Encounter for general adult medical examination without abnormal findings: Secondary | ICD-10-CM | POA: Diagnosis not present

## 2023-08-29 DIAGNOSIS — E7800 Pure hypercholesterolemia, unspecified: Secondary | ICD-10-CM | POA: Diagnosis not present

## 2023-08-29 DIAGNOSIS — N2581 Secondary hyperparathyroidism of renal origin: Secondary | ICD-10-CM | POA: Diagnosis not present

## 2023-08-29 DIAGNOSIS — I10 Essential (primary) hypertension: Secondary | ICD-10-CM | POA: Diagnosis not present

## 2023-08-29 DIAGNOSIS — I5022 Chronic systolic (congestive) heart failure: Secondary | ICD-10-CM | POA: Diagnosis not present

## 2023-08-29 DIAGNOSIS — E538 Deficiency of other specified B group vitamins: Secondary | ICD-10-CM | POA: Diagnosis not present

## 2023-08-29 DIAGNOSIS — N184 Chronic kidney disease, stage 4 (severe): Secondary | ICD-10-CM | POA: Diagnosis not present

## 2023-08-29 DIAGNOSIS — M0540 Rheumatoid myopathy with rheumatoid arthritis of unspecified site: Secondary | ICD-10-CM | POA: Diagnosis not present

## 2023-12-06 DIAGNOSIS — D631 Anemia in chronic kidney disease: Secondary | ICD-10-CM | POA: Diagnosis not present

## 2023-12-06 DIAGNOSIS — R350 Frequency of micturition: Secondary | ICD-10-CM | POA: Diagnosis not present

## 2023-12-06 DIAGNOSIS — N184 Chronic kidney disease, stage 4 (severe): Secondary | ICD-10-CM | POA: Diagnosis not present

## 2023-12-06 DIAGNOSIS — N189 Chronic kidney disease, unspecified: Secondary | ICD-10-CM | POA: Diagnosis not present

## 2023-12-06 DIAGNOSIS — E559 Vitamin D deficiency, unspecified: Secondary | ICD-10-CM | POA: Diagnosis not present

## 2023-12-06 DIAGNOSIS — I129 Hypertensive chronic kidney disease with stage 1 through stage 4 chronic kidney disease, or unspecified chronic kidney disease: Secondary | ICD-10-CM | POA: Diagnosis not present

## 2024-03-11 DIAGNOSIS — I10 Essential (primary) hypertension: Secondary | ICD-10-CM | POA: Diagnosis not present

## 2024-03-11 DIAGNOSIS — I350 Nonrheumatic aortic (valve) stenosis: Secondary | ICD-10-CM | POA: Diagnosis not present

## 2024-03-11 DIAGNOSIS — N184 Chronic kidney disease, stage 4 (severe): Secondary | ICD-10-CM | POA: Diagnosis not present

## 2024-03-11 DIAGNOSIS — I5020 Unspecified systolic (congestive) heart failure: Secondary | ICD-10-CM | POA: Diagnosis not present

## 2024-03-11 DIAGNOSIS — E7800 Pure hypercholesterolemia, unspecified: Secondary | ICD-10-CM | POA: Diagnosis not present

## 2024-03-11 DIAGNOSIS — M0540 Rheumatoid myopathy with rheumatoid arthritis of unspecified site: Secondary | ICD-10-CM | POA: Diagnosis not present

## 2024-10-25 DEATH — deceased
